# Patient Record
Sex: Female | Born: 1980 | Race: Black or African American | Hispanic: No | Marital: Single | Smoking: Never smoker
Health system: Southern US, Community
[De-identification: ages and names within clinical notes are randomized; demographics above are authoritative.]

## PROBLEM LIST (undated history)

## (undated) DIAGNOSIS — K219 Gastro-esophageal reflux disease without esophagitis: Secondary | ICD-10-CM

## (undated) DIAGNOSIS — IMO0001 Reserved for inherently not codable concepts without codable children: Secondary | ICD-10-CM

## (undated) DIAGNOSIS — D649 Anemia, unspecified: Secondary | ICD-10-CM

## (undated) DIAGNOSIS — E559 Vitamin D deficiency, unspecified: Secondary | ICD-10-CM

## (undated) DIAGNOSIS — D509 Iron deficiency anemia, unspecified: Secondary | ICD-10-CM

## (undated) DIAGNOSIS — I10 Essential (primary) hypertension: Secondary | ICD-10-CM

## (undated) DIAGNOSIS — O24419 Gestational diabetes mellitus in pregnancy, unspecified control: Secondary | ICD-10-CM

## (undated) DIAGNOSIS — G4733 Obstructive sleep apnea (adult) (pediatric): Secondary | ICD-10-CM

## (undated) HISTORY — DX: Obstructive sleep apnea (adult) (pediatric): G47.33

## (undated) HISTORY — PX: LAPAROSCOPIC GASTRIC SLEEVE RESECTION: SHX5895

## (undated) HISTORY — DX: Essential (primary) hypertension: I10

## (undated) HISTORY — DX: Gestational diabetes mellitus in pregnancy, unspecified control: O24.419

## (undated) HISTORY — DX: Gastro-esophageal reflux disease without esophagitis: K21.9

## (undated) HISTORY — DX: Reserved for inherently not codable concepts without codable children: IMO0001

---
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
## 2007-11-11 ENCOUNTER — Ambulatory Visit: Payer: Self-pay | Admitting: Internal Medicine

## 2009-08-24 ENCOUNTER — Observation Stay: Payer: Self-pay

## 2009-08-26 ENCOUNTER — Inpatient Hospital Stay: Payer: Self-pay

## 2013-07-08 HISTORY — PX: LAPAROSCOPIC GASTRIC SLEEVE RESECTION: SHX5895

## 2013-08-10 ENCOUNTER — Ambulatory Visit: Payer: Self-pay | Admitting: Specialist

## 2013-08-10 LAB — IRON AND TIBC
Iron Bind.Cap.(Total): 384 ug/dL (ref 250–450)
Iron Saturation: 16 %
Iron: 62 ug/dL (ref 50–170)
Unbound Iron-Bind.Cap.: 322 ug/dL

## 2013-08-10 LAB — CBC WITH DIFFERENTIAL/PLATELET
Basophil #: 0.1 10*3/uL (ref 0.0–0.1)
Basophil %: 1 %
Eosinophil #: 0.4 10*3/uL (ref 0.0–0.7)
Eosinophil %: 5.4 %
HCT: 34.1 % — ABNORMAL LOW (ref 35.0–47.0)
HGB: 11.4 g/dL — ABNORMAL LOW (ref 12.0–16.0)
Lymphocyte #: 2.1 10*3/uL (ref 1.0–3.6)
Lymphocyte %: 26.4 %
MCH: 29.5 pg (ref 26.0–34.0)
MCHC: 33.3 g/dL (ref 32.0–36.0)
MCV: 89 fL (ref 80–100)
Monocyte #: 0.4 x10 3/mm (ref 0.2–0.9)
Monocyte %: 4.5 %
Neutrophil #: 5 10*3/uL (ref 1.4–6.5)
Neutrophil %: 62.7 %
Platelet: 347 10*3/uL (ref 150–440)
RBC: 3.84 10*6/uL (ref 3.80–5.20)
RDW: 13.1 % (ref 11.5–14.5)
WBC: 7.9 10*3/uL (ref 3.6–11.0)

## 2013-08-10 LAB — COMPREHENSIVE METABOLIC PANEL
Albumin: 3.5 g/dL (ref 3.4–5.0)
Alkaline Phosphatase: 51 U/L
Anion Gap: 4 — ABNORMAL LOW (ref 7–16)
BUN: 11 mg/dL (ref 7–18)
Bilirubin,Total: 0.2 mg/dL (ref 0.2–1.0)
Calcium, Total: 8.7 mg/dL (ref 8.5–10.1)
Chloride: 103 mmol/L (ref 98–107)
Co2: 29 mmol/L (ref 21–32)
Creatinine: 0.77 mg/dL (ref 0.60–1.30)
EGFR (African American): >60
EGFR (Non-African Amer.): >60
Glucose: 81 mg/dL (ref 65–99)
Osmolality: 270 (ref 275–301)
Potassium: 3.7 mmol/L (ref 3.5–5.1)
SGOT(AST): 22 U/L (ref 15–37)
SGPT (ALT): 11 U/L — ABNORMAL LOW (ref 12–78)
Sodium: 136 mmol/L (ref 136–145)
Total Protein: 7.7 g/dL (ref 6.4–8.2)

## 2013-08-10 LAB — FOLATE: Folic Acid: 14.3 ng/mL (ref 3.1–100.0)

## 2013-08-10 LAB — LIPASE, BLOOD: Lipase: 122 U/L (ref 73–393)

## 2013-08-10 LAB — MAGNESIUM: Magnesium: 1.8 mg/dL

## 2013-08-10 LAB — APTT: Activated PTT: 31.2 secs (ref 23.6–35.9)

## 2013-08-10 LAB — FERRITIN: Ferritin (ARMC): 30 ng/mL (ref 8–388)

## 2013-08-10 LAB — PHOSPHORUS: Phosphorus: 3 mg/dL (ref 2.5–4.9)

## 2013-08-10 LAB — AMYLASE: Amylase: 61 U/L (ref 25–115)

## 2013-08-10 LAB — PROTIME-INR
INR: 1
Prothrombin Time: 13.1 secs (ref 11.5–14.7)

## 2013-08-10 LAB — HEMOGLOBIN A1C: Hemoglobin A1C: 5.8 % (ref 4.2–6.3)

## 2013-08-10 LAB — BILIRUBIN, DIRECT: Bilirubin, Direct: <0.1 mg/dL (ref 0.00–0.20)

## 2013-08-10 LAB — TSH: Thyroid Stimulating Horm: 1.97 u[IU]/mL

## 2013-08-19 ENCOUNTER — Ambulatory Visit: Payer: Self-pay | Admitting: Specialist

## 2013-08-23 ENCOUNTER — Ambulatory Visit: Payer: Self-pay | Admitting: Specialist

## 2013-09-05 ENCOUNTER — Ambulatory Visit: Payer: Self-pay | Admitting: Specialist

## 2013-09-14 ENCOUNTER — Ambulatory Visit: Payer: Self-pay | Admitting: Gastroenterology

## 2013-09-15 ENCOUNTER — Ambulatory Visit: Payer: Self-pay | Admitting: Specialist

## 2013-09-17 LAB — PATHOLOGY REPORT

## 2013-11-15 ENCOUNTER — Ambulatory Visit: Payer: Self-pay | Admitting: Specialist

## 2013-11-15 LAB — BASIC METABOLIC PANEL
Anion Gap: 6 — ABNORMAL LOW (ref 7–16)
BUN: 17 mg/dL (ref 7–18)
Calcium, Total: 8.7 mg/dL (ref 8.5–10.1)
Chloride: 107 mmol/L (ref 98–107)
Co2: 25 mmol/L (ref 21–32)
Creatinine: 0.83 mg/dL (ref 0.60–1.30)
EGFR (African American): >60
EGFR (Non-African Amer.): >60
Glucose: 95 mg/dL (ref 65–99)
Osmolality: 277 (ref 275–301)
Potassium: 4 mmol/L (ref 3.5–5.1)
Sodium: 138 mmol/L (ref 136–145)

## 2013-11-23 ENCOUNTER — Inpatient Hospital Stay: Payer: Self-pay | Admitting: Bariatrics

## 2013-11-24 LAB — BASIC METABOLIC PANEL
Anion Gap: 7 (ref 7–16)
BUN: 6 mg/dL — ABNORMAL LOW (ref 7–18)
Calcium, Total: 8.7 mg/dL (ref 8.5–10.1)
Chloride: 104 mmol/L (ref 98–107)
Co2: 22 mmol/L (ref 21–32)
Creatinine: 0.69 mg/dL (ref 0.60–1.30)
EGFR (African American): >60
EGFR (Non-African Amer.): >60
Glucose: 98 mg/dL (ref 65–99)
Osmolality: 264 (ref 275–301)
Potassium: 3.8 mmol/L (ref 3.5–5.1)
Sodium: 133 mmol/L — ABNORMAL LOW (ref 136–145)

## 2013-11-24 LAB — CBC WITH DIFFERENTIAL/PLATELET
Basophil #: 0 10*3/uL (ref 0.0–0.1)
Basophil %: 0.3 %
Eosinophil #: 0 10*3/uL (ref 0.0–0.7)
Eosinophil %: 0 %
HCT: 33.6 % — ABNORMAL LOW (ref 35.0–47.0)
HGB: 11 g/dL — ABNORMAL LOW (ref 12.0–16.0)
Lymphocyte #: 0.7 10*3/uL — ABNORMAL LOW (ref 1.0–3.6)
Lymphocyte %: 5.1 %
MCH: 28.7 pg (ref 26.0–34.0)
MCHC: 32.7 g/dL (ref 32.0–36.0)
MCV: 88 fL (ref 80–100)
Monocyte #: 0.4 x10 3/mm (ref 0.2–0.9)
Monocyte %: 2.8 %
Neutrophil #: 12.1 10*3/uL — ABNORMAL HIGH (ref 1.4–6.5)
Neutrophil %: 91.8 %
Platelet: 353 10*3/uL (ref 150–440)
RBC: 3.84 10*6/uL (ref 3.80–5.20)
RDW: 13.3 % (ref 11.5–14.5)
WBC: 13.2 10*3/uL — ABNORMAL HIGH (ref 3.6–11.0)

## 2013-11-24 LAB — MAGNESIUM: Magnesium: 1.8 mg/dL

## 2013-11-24 LAB — ALBUMIN: Albumin: 3.5 g/dL (ref 3.4–5.0)

## 2013-11-24 LAB — PHOSPHORUS: Phosphorus: 2.9 mg/dL (ref 2.5–4.9)

## 2013-11-25 LAB — PATHOLOGY REPORT

## 2013-12-14 ENCOUNTER — Ambulatory Visit: Payer: Self-pay | Admitting: Bariatrics

## 2014-01-05 ENCOUNTER — Ambulatory Visit: Payer: Self-pay | Admitting: Bariatrics

## 2014-10-29 NOTE — Op Note (Signed)
PATIENT NAME:  Angie Lamb, Angie Lamb MR#:  [id number] DATE OF BIRTH:  Dec 22, 1980  DATE OF PROCEDURE:  11/23/2013  PREOPERATIVE DIAGNOSIS: Morbid obesity.   POSTOPERATIVE DIAGNOSIS: Morbid obesity.   PROCEDURE: Laparoscopic sleeve gastrectomy.   SURGEON: Kreg Shropshire, M.D.   ASSISTANT: Liliane Bade, PA from (Dictation Anomaly)[employer].   ANESTHESIA: General endotracheal.   CLINICAL HISTORY: See H and P.   DETAILS OF PROCEDURE: The patient was taken to the operating room and placed on the operating room table, in the supine position, with appropriate monitors and supplemental oxygen being delivered.  Broad spectrum IV antibiotics were administered. The patient was placed under general anesthesia without incident.  The abdomen was prepped and draped in the usual sterile fashion.  Access was obtained using 5 mm Optical trocar. Pneumoperitoneum was established without difficulty. Multiple other ports were placed in preparation for sleeve gastrectomy. A liver retractor was placed without incident. The entire stomach was mobilized from 5 cm from the pylorus all the way up to the fundus, and the fundus was mobilized off the left crura as well completely freeing up the posterior portion of the stomach. Posterior attachments were taken down so the crura could be visualized from both sides.  At that point, everything was removed from the stomach and a 12 [place] Bougie was placed down into the antrum. An Echelon green load stapler was used to bisect the antrum on first fire starting approximately 5 to 6 cm from the pylorus. I then continued up along the Bougie using a blue load stapler with excellent affect with care not to get too close to the Bougie itself, with minimal traction. This continued all the way up to the left crura. The excess stomach was placed on the side and the Bougie was removed and endoscopy showed no evidence of obstruction at that time.  The excess stomach was removed through the abdominal  cavity, and the wounds were closed using 4-0 Vicryl and Dermabond.   ESTIMATED BLOOD LOSS: None.   SPECIMENS: Portion of stomach.  COMPLICATIONS: None.  BOUGIE SIZE: 36-French.   ____________________________ Kreg Shropshire, MD jb:aw D: 11/23/2013 11:33:09 ET T: 11/23/2013 11:39:46 ET JOB#: [id number]  cc: Kreg Shropshire, MD, <Dictator> Wille Glaser Langley Adie MD ELECTRONICALLY SIGNED 11/30/2013 9:25

## 2015-12-28 ENCOUNTER — Encounter: Payer: Self-pay | Admitting: Hematology and Oncology

## 2015-12-28 ENCOUNTER — Other Ambulatory Visit: Payer: Self-pay | Admitting: Hematology and Oncology

## 2015-12-28 ENCOUNTER — Inpatient Hospital Stay: Payer: BLUE CROSS/BLUE SHIELD | Attending: Hematology and Oncology | Admitting: Hematology and Oncology

## 2015-12-28 ENCOUNTER — Inpatient Hospital Stay: Payer: BLUE CROSS/BLUE SHIELD

## 2015-12-28 VITALS — BP 102/70 | HR 83 | Temp 97.9°F | Resp 16 | Ht 60.0 in | Wt 153.6 lb

## 2015-12-28 DIAGNOSIS — K5900 Constipation, unspecified: Secondary | ICD-10-CM | POA: Diagnosis not present

## 2015-12-28 DIAGNOSIS — D649 Anemia, unspecified: Secondary | ICD-10-CM

## 2015-12-28 DIAGNOSIS — Z9884 Bariatric surgery status: Secondary | ICD-10-CM

## 2015-12-28 DIAGNOSIS — G4730 Sleep apnea, unspecified: Secondary | ICD-10-CM

## 2015-12-28 DIAGNOSIS — D509 Iron deficiency anemia, unspecified: Secondary | ICD-10-CM | POA: Insufficient documentation

## 2015-12-28 LAB — IRON AND TIBC
Iron: 69 ug/dL (ref 28–170)
Saturation Ratios: 17 % (ref 10.4–31.8)
TIBC: 396 ug/dL (ref 250–450)
UIBC: 328 ug/dL

## 2015-12-28 LAB — COMPREHENSIVE METABOLIC PANEL
ALT: 8 U/L — ABNORMAL LOW (ref 14–54)
AST: 16 U/L (ref 15–41)
Albumin: 4.1 g/dL (ref 3.5–5.0)
Alkaline Phosphatase: 34 U/L — ABNORMAL LOW (ref 38–126)
Anion gap: 4 — ABNORMAL LOW (ref 5–15)
BUN: 17 mg/dL (ref 6–20)
CO2: 28 mmol/L (ref 22–32)
Calcium: 8.8 mg/dL — ABNORMAL LOW (ref 8.9–10.3)
Chloride: 102 mmol/L (ref 101–111)
Creatinine, Ser: 0.71 mg/dL (ref 0.44–1.00)
GFR calc Af Amer: >60 mL/min (ref >60)
GFR calc non Af Amer: >60 mL/min (ref >60)
Glucose, Bld: 83 mg/dL (ref 65–99)
Potassium: 4 mmol/L (ref 3.5–5.1)
Sodium: 134 mmol/L — ABNORMAL LOW (ref 135–145)
Total Bilirubin: 0.4 mg/dL (ref 0.3–1.2)
Total Protein: 7.4 g/dL (ref 6.5–8.1)

## 2015-12-28 LAB — CBC WITH DIFFERENTIAL/PLATELET
Basophils Absolute: 0.1 10*3/uL (ref 0–0.1)
Basophils Relative: 1 %
Eosinophils Absolute: 0.2 10*3/uL (ref 0–0.7)
Eosinophils Relative: 3 %
HCT: 33.7 % — ABNORMAL LOW (ref 35.0–47.0)
Hemoglobin: 11.3 g/dL — ABNORMAL LOW (ref 12.0–16.0)
Lymphocytes Relative: 26 %
Lymphs Abs: 1.7 10*3/uL (ref 1.0–3.6)
MCH: 30.7 pg (ref 26.0–34.0)
MCHC: 33.5 g/dL (ref 32.0–36.0)
MCV: 91.7 fL (ref 80.0–100.0)
Monocytes Absolute: 0.2 10*3/uL (ref 0.2–0.9)
Monocytes Relative: 4 %
Neutro Abs: 4.2 10*3/uL (ref 1.4–6.5)
Neutrophils Relative %: 66 %
Platelets: 296 10*3/uL (ref 150–440)
RBC: 3.67 MIL/uL — ABNORMAL LOW (ref 3.80–5.20)
RDW: 12.4 % (ref 11.5–14.5)
WBC: 6.4 10*3/uL (ref 3.6–11.0)

## 2015-12-28 LAB — FOLATE: Folate: 21.1 ng/mL (ref >5.9)

## 2015-12-28 LAB — VITAMIN B12: Vitamin B-12: 772 pg/mL (ref 180–914)

## 2015-12-28 LAB — FERRITIN: Ferritin: 23 ng/mL (ref 11–307)

## 2015-12-28 NOTE — Patient Instructions (Signed)
Iron Sucrose injection  What is this medicine?  IRON SUCROSE (AHY ern SOO krohs) is an iron complex. Iron is used to make healthy red blood cells, which carry oxygen and nutrients throughout the body. This medicine is used to treat iron deficiency anemia in people with chronic kidney disease.  This medicine may be used for other purposes; ask your health care provider or pharmacist if you have questions.  What should I tell my health care provider before I take this medicine?  They need to know if you have any of these conditions:  -anemia not caused by low iron levels  -heart disease  -high levels of iron in the blood  -kidney disease  -liver disease  -an unusual or allergic reaction to iron, other medicines, foods, dyes, or preservatives  -pregnant or trying to get pregnant  -breast-feeding  How should I use this medicine?  This medicine is for infusion into a vein. It is given by a health care professional in a hospital or clinic setting.  Talk to your pediatrician regarding the use of this medicine in children. While this drug may be prescribed for children as young as 2 years for selected conditions, precautions do apply.  Overdosage: If you think you have taken too much of this medicine contact a poison control center or emergency room at once.  NOTE: This medicine is only for you. Do not share this medicine with others.  What if I miss a dose?  It is important not to miss your dose. Call your doctor or health care professional if you are unable to keep an appointment.  What may interact with this medicine?  Do not take this medicine with any of the following medications:  -deferoxamine  -dimercaprol  -other iron products  This medicine may also interact with the following medications:  -chloramphenicol  -deferasirox  This list may not describe all possible interactions. Give your health care provider a list of all the medicines, herbs, non-prescription drugs, or dietary supplements you use. Also tell them if  you smoke, drink alcohol, or use illegal drugs. Some items may interact with your medicine.  What should I watch for while using this medicine?  Visit your doctor or healthcare professional regularly. Tell your doctor or healthcare professional if your symptoms do not start to get better or if they get worse. You may need blood work done while you are taking this medicine.  You may need to follow a special diet. Talk to your doctor. Foods that contain iron include: whole grains/cereals, dried fruits, beans, or peas, leafy green vegetables, and organ meats (liver, kidney).  What side effects may I notice from receiving this medicine?  Side effects that you should report to your doctor or health care professional as soon as possible:  -allergic reactions like skin rash, itching or hives, swelling of the face, lips, or tongue  -breathing problems  -changes in blood pressure  -cough  -fast, irregular heartbeat  -feeling faint or lightheaded, falls  -fever or chills  -flushing, sweating, or hot feelings  -joint or muscle aches/pains  -seizures  -swelling of the ankles or feet  -unusually weak or tired  Side effects that usually do not require medical attention (report to your doctor or health care professional if they continue or are bothersome):  -diarrhea  -feeling achy  -headache  -irritation at site where injected  -nausea, vomiting  -stomach upset  -tiredness  This list may not describe all possible side effects. Call your doctor   for medical advice about side effects. You may report side effects to FDA at 1-800-FDA-1088.  Where should I keep my medicine?  This drug is given in a hospital or clinic and will not be stored at home.  NOTE: This sheet is a summary. It may not cover all possible information. If you have questions about this medicine, talk to your doctor, pharmacist, or health care provider.      2016, Elsevier/Gold Standard. (2011-04-04 17:14:35)

## 2015-12-28 NOTE — Progress Notes (Signed)
[facility] [facility] day:  12/28/2015  Chief Complaint: Angie Lamb is a 35 y.o. female s/p gastric sleeve with anemia who is referred in consultation by Dr. Kreg Shropshire for assessment and management.  HPI: The patient notes a history of anemia requiring oral iron during pregnancy.  She underwent gastric sleeve surgery in 11/2013.  She has lost 87 pounds since her surgery.  She has met her goal weight.  Weight has been stable for 1 year.  She stats that she was put on oral iron before and after her surgery.  She takes 1 iron pill a day without OJ or vitamin C.  She has never had a transfusion.  Regarding her diet, she "eats a little of everything".  She has a protein shake for breakfast.  She eats meat 2 times a day.  She eats small portions.  She eats green leafy vegetables once a day.  She denies any pica.  She denies any melena or hematochezia.  Menses is normal, but slightly longer (4 days). She denies any hematuria.    She is on no new medications or herbal products.  She denies any family history of anemia.  Labs on 09/27/2014 revealed a hematocrit of 35.7, hemoglobin 11.6, MCV 91.3, platelets 384,000, WBC 7100 with an ANC of 4900.  Creatinine was 0.63.  Liver function tests were normal.  B12 was 1344.  Folate was > 20.  Ferritin was 46.  Symptomatically, she has been "tired forever".  She describes sleep apnea before her surgery.  Past Medical History  Diagnosis Date  . Sleep apnea, obstructive   . Reflux     Past Surgical History  Procedure Laterality Date  . Laparoscopic gastric sleeve resection      History reviewed. No pertinent family history.  Social History:  reports that she has never smoked. She has never used smokeless tobacco. She reports that she drinks alcohol. She reports that she does not use illicit drugs.  She has 3 children (age 39, 47, 60).  The patient is alone today.  Allergies:  Allergies  Allergen Reactions  .  Nsaids Other (See Comments)    Current Medications: Current Outpatient Prescriptions  Medication Sig Dispense Refill  . b complex vitamins capsule Take by mouth.    . calcium-vitamin D (SM CALCIUM 500/VITAMIN D3) 500-400 MG-UNIT tablet Take by mouth.    . Ferrous Sulfate (IRON) 325 (65 Fe) MG TABS Take by mouth.    Marland Kitchen FINACEA 15 % FOAM Reported on 12/28/2015  0  . Magnesium Ascorbate POWD     . Multiple Vitamin (MULTI-VITAMINS) TABS Take by mouth.    . fluticasone (FLONASE) 50 MCG/ACT nasal spray Place into the nose. Reported on 12/28/2015     No current facility-administered medications for this visit.    Review of Systems:  GENERAL:  Fatigued.  No energy.  No fevers or sweats.  No weight loss in past year (87 pounds lost since surgery in 2015). PERFORMANCE STATUS (ECOG):  1 HEENT:  No visual changes, runny nose, sore throat, mouth sores or tenderness. Lungs: No shortness of breath or cough.  No hemoptysis. Cardiac:  No chest pain, palpitations, orthopnea, or PND. GI:  No nausea, vomiting, diarrhea, constipation, melena or hematochezia. GU:  No urgency, frequency, dysuria, or hematuria. Musculoskeletal:  No back pain.  No joint pain.  No muscle tenderness. Extremities:  No pain or swelling. Skin:  No rashes or skin changes. Neuro:  No headache, numbness  or weakness, balance or coordination issues. Endocrine:  No diabetes, thyroid issues, hot flashes or night sweats. Psych:  No mood changes, depression or anxiety. Pain:  No focal pain. Review of systems:  All other systems reviewed and found to be negative.  Physical Exam: Blood pressure 102/70, pulse 83, temperature 97.9 F (36.6 C), resp. rate 16, height 5' (1.524 m), weight 153 lb 8.8 oz (69.65 kg). GENERAL:  Well developed, well nourished, sitting comfortably in the exam room in no acute distress. MENTAL STATUS:  Alert and oriented to person, place and time. HEAD:  Shoulder length brown hair.  Normocephalic, atraumatic, face  symmetric, no Cushingoid features. EYES:  Glasses.  Brown eyes.  Pupils equal round and reactive to light and accomodation.  No conjunctivitis or scleral icterus. ENT:  Oropharynx clear without lesion.  Tongue normal. Mucous membranes moist.  RESPIRATORY:  Clear to auscultation without rales, wheezes or rhonchi. CARDIOVASCULAR:  Regular rate and rhythm without murmur, rub or gallop. ABDOMEN:  Soft, non-tender, with active bowel sounds, and no hepatosplenomegaly.  No masses. SKIN:  No rashes, ulcers or lesions. EXTREMITIES: No edema, no skin discoloration or tenderness.  No palpable cords. LYMPH NODES: No palpable cervical, supraclavicular, axillary or inguinal adenopathy  NEUROLOGICAL: Unremarkable. PSYCH:  Appropriate.  Appointment on 12/28/2015  Component Date Value Ref Range Status  . WBC 12/28/2015 6.4  3.6 - 11.0 K/uL Final  . RBC 12/28/2015 3.67* 3.80 - 5.20 MIL/uL Final  . Hemoglobin 12/28/2015 11.3* 12.0 - 16.0 g/dL Final  . HCT 12/28/2015 33.7* 35.0 - 47.0 % Final  . MCV 12/28/2015 91.7  80.0 - 100.0 fL Final  . MCH 12/28/2015 30.7  26.0 - 34.0 pg Final  . MCHC 12/28/2015 33.5  32.0 - 36.0 g/dL Final  . RDW 12/28/2015 12.4  11.5 - 14.5 % Final  . Platelets 12/28/2015 296  150 - 440 K/uL Final  . Neutrophils Relative % 12/28/2015 66   Final  . Neutro Abs 12/28/2015 4.2  1.4 - 6.5 K/uL Final  . Lymphocytes Relative 12/28/2015 26   Final  . Lymphs Abs 12/28/2015 1.7  1.0 - 3.6 K/uL Final  . Monocytes Relative 12/28/2015 4   Final  . Monocytes Absolute 12/28/2015 0.2  0.2 - 0.9 K/uL Final  . Eosinophils Relative 12/28/2015 3   Final  . Eosinophils Absolute 12/28/2015 0.2  0 - 0.7 K/uL Final  . Basophils Relative 12/28/2015 1   Final  . Basophils Absolute 12/28/2015 0.1  0 - 0.1 K/uL Final  . Sodium 12/28/2015 134* 135 - 145 mmol/L Final  . Potassium 12/28/2015 4.0  3.5 - 5.1 mmol/L Final  . Chloride 12/28/2015 102  101 - 111 mmol/L Final  . CO2 12/28/2015 28  22 - 32 mmol/L  Final  . Glucose, Bld 12/28/2015 83  65 - 99 mg/dL Final  . BUN 12/28/2015 17  6 - 20 mg/dL Final  . Creatinine, Ser 12/28/2015 0.71  0.44 - 1.00 mg/dL Final  . Calcium 12/28/2015 8.8* 8.9 - 10.3 mg/dL Final  . Total Protein 12/28/2015 7.4  6.5 - 8.1 g/dL Final  . Albumin 12/28/2015 4.1  3.5 - 5.0 g/dL Final  . AST 12/28/2015 16  15 - 41 U/L Final  . ALT 12/28/2015 8* 14 - 54 U/L Final  . Alkaline Phosphatase 12/28/2015 34* 38 - 126 U/L Final  . Total Bilirubin 12/28/2015 0.4  0.3 - 1.2 mg/dL Final  . GFR calc non Af Amer 12/28/2015 >60  >60 mL/min Final  .  GFR calc Af Amer 12/28/2015 >60  >60 mL/min Final   Comment: (NOTE) The eGFR has been calculated using the CKD EPI equation. This calculation has not been validated in all clinical situations. eGFR's persistently <60 mL/min signify possible Chronic Kidney Disease.   . Anion gap 12/28/2015 4* 5 - 15 Final  . Ferritin 12/28/2015 23  11 - 307 ng/mL Final  . Iron 12/28/2015 69  28 - 170 ug/dL Final  . TIBC 12/28/2015 396  250 - 450 ug/dL Final  . Saturation Ratios 12/28/2015 17  10.4 - 31.8 % Final  . UIBC 12/28/2015 328   Final  . Vitamin B-12 12/28/2015 772  180 - 914 pg/mL Final   Comment: (NOTE) This assay is not validated for testing neonatal or myeloproliferative syndrome specimens for Vitamin B12 levels. Performed at [facility]   . Folate 12/28/2015 21.1  >5.9 ng/mL Final    Assessment:  Angie Lamb is a 36 y.o. female s/p gastric sleeve surgery in 11/2013.  She has a normocytic anemia.  She has been on oral iron since before her surgery.  She takes B complex vitamins.  She denies any melena or hematochezia.  Menses are normal.  Diet is good.  She denies any pica.  Symptomatically, she notes chronic fatigue.  She has a history of sleep apnea prior to her weight loss.  Exam is unremarkable.  Plan: 1.  Review diagnosis of anemia.  Discuss possible iron or B12 deficiency despite oral supplementation in patients  with gastric surgery.  Discuss taking oral iron with vitamin C or OJ.  Discuss baseline labs.  Discuss possible IV iron.  Side effects reviewed. 2.  Labs today:  CBC with diff, CMP, ferritin, iron studies, B12, folate. 3.  Preauth Venofer 4.  RTC on 01/05/2016 for review of work-up and +/- Venofer.   Lequita Asal, MD  12/28/2015, 3:48 PM

## 2015-12-28 NOTE — Progress Notes (Signed)
Pt reports she has moderate fatigue.  She goes to work and other activities but she would just like to be home resting.  Takes OTC iron daily.

## 2016-01-05 ENCOUNTER — Encounter: Payer: Self-pay | Admitting: Hematology and Oncology

## 2016-01-05 ENCOUNTER — Other Ambulatory Visit: Payer: Self-pay | Admitting: Hematology and Oncology

## 2016-01-05 ENCOUNTER — Inpatient Hospital Stay (HOSPITAL_BASED_OUTPATIENT_CLINIC_OR_DEPARTMENT_OTHER): Payer: BLUE CROSS/BLUE SHIELD | Admitting: Hematology and Oncology

## 2016-01-05 ENCOUNTER — Inpatient Hospital Stay: Payer: BLUE CROSS/BLUE SHIELD

## 2016-01-05 VITALS — BP 116/74 | HR 82 | Temp 98.1°F | Wt 156.3 lb

## 2016-01-05 DIAGNOSIS — G4730 Sleep apnea, unspecified: Secondary | ICD-10-CM | POA: Diagnosis not present

## 2016-01-05 DIAGNOSIS — K5900 Constipation, unspecified: Secondary | ICD-10-CM | POA: Diagnosis not present

## 2016-01-05 DIAGNOSIS — D509 Iron deficiency anemia, unspecified: Secondary | ICD-10-CM

## 2016-01-05 DIAGNOSIS — D649 Anemia, unspecified: Secondary | ICD-10-CM | POA: Diagnosis not present

## 2016-01-05 DIAGNOSIS — Z9884 Bariatric surgery status: Secondary | ICD-10-CM

## 2016-01-05 DIAGNOSIS — Z79899 Other long term (current) drug therapy: Secondary | ICD-10-CM

## 2016-01-05 NOTE — Progress Notes (Addendum)
[place] [place] [facility] day:  01/05/2016  Chief Complaint: MAKEDA PEEKS is a 35 y.o. female s/p gastric sleeve with anemia who is seen for review of work-up and discussion regarding direction of therapy.  HPI: The patient was last seen in the hematology clinic on 12/28/2015.  At that time, she was seen for initial consultation regarding anemia.  She had a mild normocytic anemia.  She had been on oral iron since before her surgery in 11/2013.  She was taking B complex vitamins.  She denied any bleeding.  Work-up revealed a hematocrit 33.7, hemoglobin 11.3 and MCV 91.7.  Comprehensive metabolic panel was normal.  Ferritin was 23 (low).  Iron studies included a saturation of 17% and a TIBC of 396.  Vitamin B12 was 772 and folate 21.1.  During the interim, she notes no concerns.  She has constipation for which she is using probiotics.   Past Medical History  Diagnosis Date  . Sleep apnea, obstructive   . Reflux     Past Surgical History  Procedure Laterality Date  . Laparoscopic gastric sleeve resection      History reviewed. No pertinent family history.  Social History:  reports that she has never smoked. She has never used smokeless tobacco. She reports that she drinks alcohol. She reports that she does not use illicit drugs.  She has 3 children (age 35, 19, 35).  The patient is alone today.  Allergies:  Allergies  Allergen Reactions  . Nsaids Other (See Comments)    Current Medications: Current Outpatient Prescriptions  Medication Sig Dispense Refill  . calcium-vitamin D (SM CALCIUM 500/VITAMIN D3) 500-400 MG-UNIT tablet Take by mouth.    . Ferrous Sulfate (IRON) 325 (65 Fe) MG TABS Take by mouth.    Marland Kitchen FINACEA 15 % FOAM Reported on 12/28/2015  0  . Magnesium Ascorbate POWD     . Multiple Vitamin (MULTI-VITAMINS) TABS Take by mouth.    Marland Kitchen b complex vitamins capsule Take by mouth.     No current facility-administered medications for this visit.     Review of Systems:  GENERAL:  Fatigued.  No energy.  No fevers or sweats.  No weight loss in past year (87 pounds lost since surgery in 2015). PERFORMANCE STATUS (ECOG):  1 HEENT:  No visual changes, runny nose, sore throat, mouth sores or tenderness. Lungs: No shortness of breath or cough.  No hemoptysis. Cardiac:  No chest pain, palpitations, orthopnea, or PND. GI:  Constipation.  No nausea, vomiting, diarrhea,  melena or hematochezia. GU:  No urgency, frequency, dysuria, or hematuria. Musculoskeletal:  No back pain.  No joint pain.  No muscle tenderness. Extremities:  No pain or swelling. Skin:  No rashes or skin changes. Neuro:  No headache, numbness or weakness, balance or coordination issues. Endocrine:  No diabetes, thyroid issues, hot flashes or night sweats. Psych:  No mood changes, depression or anxiety. Pain:  No focal pain. Review of systems:  All other systems reviewed and found to be negative.  Physical Exam: Blood pressure 116/74, pulse 82, temperature 98.1 F (36.7 C), temperature source Tympanic, weight 156 lb 4.9 oz (70.9 kg). GENERAL:  Well developed, well nourished, sitting comfortably in the exam room in no acute distress. MENTAL STATUS:  Alert and oriented to person, place and time. HEAD:  Shoulder length brown hair.  Normocephalic, atraumatic, face symmetric, no Cushingoid features. EYES:  Glasses.  Brown eyes.  No conjunctivitis or scleral icterus. NEUROLOGICAL: Unremarkable. PSYCH:  Appropriate.  No visits with results within 3 Day(s) from this visit. Latest known visit with results is:  Appointment on 12/28/2015  Component Date Value Ref Range Status  . WBC 12/28/2015 6.4  3.6 - 11.0 K/uL Final  . RBC 12/28/2015 3.67* 3.80 - 5.20 MIL/uL Final  . Hemoglobin 12/28/2015 11.3* 12.0 - 16.0 g/dL Final  . HCT 12/28/2015 33.7* 35.0 - 47.0 % Final  . MCV 12/28/2015 91.7  80.0 - 100.0 fL Final  . MCH 12/28/2015 30.7  26.0 - 34.0 pg Final  . MCHC 12/28/2015  33.5  32.0 - 36.0 g/dL Final  . RDW 12/28/2015 12.4  11.5 - 14.5 % Final  . Platelets 12/28/2015 296  150 - 440 K/uL Final  . Neutrophils Relative % 12/28/2015 66   Final  . Neutro Abs 12/28/2015 4.2  1.4 - 6.5 K/uL Final  . Lymphocytes Relative 12/28/2015 26   Final  . Lymphs Abs 12/28/2015 1.7  1.0 - 3.6 K/uL Final  . Monocytes Relative 12/28/2015 4   Final  . Monocytes Absolute 12/28/2015 0.2  0.2 - 0.9 K/uL Final  . Eosinophils Relative 12/28/2015 3   Final  . Eosinophils Absolute 12/28/2015 0.2  0 - 0.7 K/uL Final  . Basophils Relative 12/28/2015 1   Final  . Basophils Absolute 12/28/2015 0.1  0 - 0.1 K/uL Final  . Sodium 12/28/2015 134* 135 - 145 mmol/L Final  . Potassium 12/28/2015 4.0  3.5 - 5.1 mmol/L Final  . Chloride 12/28/2015 102  101 - 111 mmol/L Final  . CO2 12/28/2015 28  22 - 32 mmol/L Final  . Glucose, Bld 12/28/2015 83  65 - 99 mg/dL Final  . BUN 12/28/2015 17  6 - 20 mg/dL Final  . Creatinine, Ser 12/28/2015 0.71  0.44 - 1.00 mg/dL Final  . Calcium 12/28/2015 8.8* 8.9 - 10.3 mg/dL Final  . Total Protein 12/28/2015 7.4  6.5 - 8.1 g/dL Final  . Albumin 12/28/2015 4.1  3.5 - 5.0 g/dL Final  . AST 12/28/2015 16  15 - 41 U/L Final  . ALT 12/28/2015 8* 14 - 54 U/L Final  . Alkaline Phosphatase 12/28/2015 34* 38 - 126 U/L Final  . Total Bilirubin 12/28/2015 0.4  0.3 - 1.2 mg/dL Final  . GFR calc non Af Amer 12/28/2015 >60  >60 mL/min Final  . GFR calc Af Amer 12/28/2015 >60  >60 mL/min Final   Comment: (NOTE) The eGFR has been calculated using the CKD EPI equation. This calculation has not been validated in all clinical situations. eGFR's persistently <60 mL/min signify possible Chronic Kidney Disease.   . Anion gap 12/28/2015 4* 5 - 15 Final  . Ferritin 12/28/2015 23  11 - 307 ng/mL Final  . Iron 12/28/2015 69  28 - 170 ug/dL Final  . TIBC 12/28/2015 396  250 - 450 ug/dL Final  . Saturation Ratios 12/28/2015 17  10.4 - 31.8 % Final  . UIBC 12/28/2015 328   Final   . Vitamin B-12 12/28/2015 772  180 - 914 pg/mL Final   Comment: (NOTE) This assay is not validated for testing neonatal or myeloproliferative syndrome specimens for Vitamin B12 levels. Performed at [facility]   . Folate 12/28/2015 21.1  >5.9 ng/mL Final    Assessment:  MARGARIE MCGUIRT is a 35 y.o. female s/p gastric sleeve surgery in 11/2013.  She has a normocytic anemia.  She has been on oral iron since before her surgery.  She takes B complex vitamins.  She has mild iron deficiency anemia.  Work-up on 12/28/2015 revealed a low ferritin (23).  B12 and folate were normal.  She denies any melena or hematochezia.  Menses are normal.  Diet is good.  She denies any pica.  Symptomatically, she notes chronic fatigue.  She has a history of sleep apnea prior to her weight loss.  Exam is unremarkable.  Plan: 1.  Review work-up.  Discuss taking oral iron with vitamin C or OJ.  Discuss increasing iron as tolerated to 1 BID. Discuss management of constipation.  Discuss possible IV iron if intolerant of oral iron. 2.  Preauth Venofer 3.  RTC in 1 month for labs (CBC with diff, ferritin, retic). 4.  RTC in 3 months for MD assess and labs (CBC with diff, ferritin).   Lequita Asal, MD  01/05/2016, 1:59 PM

## 2016-01-06 ENCOUNTER — Encounter: Payer: Self-pay | Admitting: Hematology and Oncology

## 2016-02-02 ENCOUNTER — Inpatient Hospital Stay: Payer: BLUE CROSS/BLUE SHIELD | Attending: Hematology and Oncology | Admitting: *Deleted

## 2016-02-02 DIAGNOSIS — D509 Iron deficiency anemia, unspecified: Secondary | ICD-10-CM | POA: Insufficient documentation

## 2016-02-02 LAB — RETICULOCYTES
RBC.: 3.57 MIL/uL — ABNORMAL LOW (ref 3.80–5.20)
Retic Count, Absolute: 28.6 10*3/uL (ref 19.0–183.0)
Retic Ct Pct: 0.8 % (ref 0.4–3.1)

## 2016-02-02 LAB — CBC WITH DIFFERENTIAL/PLATELET
Basophils Absolute: 0 10*3/uL (ref 0–0.1)
Basophils Relative: 1 %
Eosinophils Absolute: 0.2 10*3/uL (ref 0–0.7)
Eosinophils Relative: 3 %
HCT: 32.6 % — ABNORMAL LOW (ref 35.0–47.0)
Hemoglobin: 11.1 g/dL — ABNORMAL LOW (ref 12.0–16.0)
Lymphocytes Relative: 29 %
Lymphs Abs: 2 10*3/uL (ref 1.0–3.6)
MCH: 31 pg (ref 26.0–34.0)
MCHC: 34 g/dL (ref 32.0–36.0)
MCV: 91.2 fL (ref 80.0–100.0)
Monocytes Absolute: 0.3 10*3/uL (ref 0.2–0.9)
Monocytes Relative: 5 %
Neutro Abs: 4.2 10*3/uL (ref 1.4–6.5)
Neutrophils Relative %: 62 %
Platelets: 268 10*3/uL (ref 150–440)
RBC: 3.57 MIL/uL — ABNORMAL LOW (ref 3.80–5.20)
RDW: 12.8 % (ref 11.5–14.5)
WBC: 6.9 10*3/uL (ref 3.6–11.0)

## 2016-02-02 LAB — FERRITIN: Ferritin: 21 ng/mL (ref 11–307)

## 2016-04-05 ENCOUNTER — Other Ambulatory Visit: Payer: BLUE CROSS/BLUE SHIELD

## 2016-04-05 ENCOUNTER — Ambulatory Visit: Payer: BLUE CROSS/BLUE SHIELD | Admitting: Hematology and Oncology

## 2017-07-25 ENCOUNTER — Other Ambulatory Visit: Payer: Self-pay | Admitting: Nurse Practitioner

## 2017-07-25 DIAGNOSIS — E669 Obesity, unspecified: Secondary | ICD-10-CM

## 2017-07-25 MED ORDER — PHENTERMINE HCL 37.5 MG PO TABS: 37.5000 mg | ORAL_TABLET | Freq: Every day | ORAL | 0 refills | Status: DC

## 2017-07-25 NOTE — Progress Notes (Signed)
Sent single, 30 day prescription for phentermine daily. Take once. Will need to be seen for further refills.

## 2017-09-16 ENCOUNTER — Other Ambulatory Visit: Payer: Self-pay

## 2017-09-16 MED ORDER — OSELTAMIVIR PHOSPHATE 75 MG PO CAPS: 75.0000 mg | ORAL_CAPSULE | Freq: Two times a day (BID) | ORAL | 0 refills | Status: DC

## 2017-10-06 ENCOUNTER — Encounter: Payer: Self-pay | Admitting: Nurse Practitioner

## 2017-10-06 ENCOUNTER — Ambulatory Visit: Payer: BLUE CROSS/BLUE SHIELD | Admitting: Nurse Practitioner

## 2017-10-06 VITALS — BP 122/72 | HR 78 | Resp 16 | Ht 61.0 in | Wt 168.8 lb

## 2017-10-06 DIAGNOSIS — R5383 Other fatigue: Secondary | ICD-10-CM | POA: Diagnosis not present

## 2017-10-06 DIAGNOSIS — D509 Iron deficiency anemia, unspecified: Secondary | ICD-10-CM | POA: Diagnosis not present

## 2017-10-06 DIAGNOSIS — E669 Obesity, unspecified: Secondary | ICD-10-CM | POA: Insufficient documentation

## 2017-10-06 MED ORDER — PHENTERMINE HCL 37.5 MG PO TABS: 37.5000 mg | ORAL_TABLET | Freq: Every day | ORAL | 1 refills | Status: DC

## 2017-10-06 NOTE — Progress Notes (Signed)
[facility] [place], [place] [address]  Internal MEDICINE  Office Visit Note  Patient Name: Angie Lamb  [id number]  [id number]  Date of Service: 10/06/2017  Chief Complaint  Patient presents with  . Fatigue    The patient is here for routine follow up. Biggest concern is fatigue. She does work full time and is rasing three children. Feels like weather and work stress does play a part in the fatigue she is feeling. She states that she had stopped taking her iron supplements and other vitamins over past few months. She recently started taking them again. She did restart phentermine daily. This does help with increased energy while at work, but still feels wiped out at the end of the day.    Pt is here for routine follow up.    Current Medication: Outpatient Encounter Medications as of 10/06/2017  Medication Sig Note  . b complex vitamins capsule Take by mouth. 12/28/2015: Received from: [place]  . calcium-vitamin D (SM CALCIUM 500/VITAMIN D3) 500-400 MG-UNIT tablet Take by mouth. 12/28/2015: Received from: [place]  . Ferrous Sulfate (IRON) 325 (65 Fe) MG TABS Take by mouth.   . Magnesium Ascorbate POWD  12/28/2015: Received from: [facility]  . Multiple Vitamin (MULTI-VITAMINS) TABS Take by mouth. 12/28/2015: Received from: [place]  . phentermine (ADIPEX-P) 37.5 MG tablet Take 1 tablet (37.5 mg total) by mouth daily before breakfast.   . [DISCONTINUED] phentermine (ADIPEX-P) 37.5 MG tablet Take 1 tablet (37.5 mg total) by mouth daily before breakfast.   . [DISCONTINUED] FINACEA 15 % FOAM Reported on 12/28/2015 12/28/2015: Received from: External Pharmacy  . [DISCONTINUED] oseltamivir (TAMIFLU) 75 MG capsule Take 1 capsule (75 mg total) by mouth 2 (two) times daily. For 5 days (Patient not taking: Reported on 10/06/2017)    No facility-administered encounter medications on file as of  10/06/2017.     Surgical History: Past Surgical History:  Procedure Laterality Date  . LAPAROSCOPIC GASTRIC SLEEVE RESECTION      Medical History: Past Medical History:  Diagnosis Date  . Reflux   . Sleep apnea, obstructive     Family History: History reviewed. No pertinent family history.  Social History   Socioeconomic History  . Marital status: Single    Spouse name: Not on file  . Number of children: Not on file  . Years of education: Not on file  . Highest education level: Not on file  Occupational History  . Not on file  Social Needs  . Financial resource strain: Not on file  . Food insecurity:    Worry: Not on file    Inability: Not on file  . Transportation needs:    Medical: Not on file    Non-medical: Not on file  Tobacco Use  . Smoking status: Never Smoker  . Smokeless tobacco: Never Used  Substance and Sexual Activity  . Alcohol use: Yes    Comment: Occasionally every few months   . Drug use: No  . Sexual activity: Not on file  Lifestyle  . Physical activity:    Days per week: Not on file    Minutes per session: Not on file  . Stress: Not on file  Relationships  . Social connections:    Talks on phone: Not on file    Gets together: Not on file    Attends religious service: Not on file    Active member of club or  organization: Not on file    Attends meetings of clubs or organizations: Not on file    Relationship status: Not on file  . Intimate partner violence:    Fear of current or ex partner: Not on file    Emotionally abused: Not on file    Physically abused: Not on file    Forced sexual activity: Not on file  Other Topics Concern  . Not on file  Social History Narrative  . Not on file      Review of Systems  Constitutional: Positive for fatigue. Negative for chills and unexpected weight change.  HENT: Negative for congestion, postnasal drip, rhinorrhea, sneezing and sore throat.   Eyes: Negative.  Negative for redness.   Respiratory: Negative for cough, chest tightness, shortness of breath and wheezing.   Cardiovascular: Negative for chest pain and palpitations.  Gastrointestinal: Negative for abdominal pain, constipation, diarrhea, nausea and vomiting.  Endocrine: Negative for cold intolerance, heat intolerance, polydipsia, polyphagia and polyuria.  Genitourinary: Negative.  Negative for dysuria and frequency.  Musculoskeletal: Negative for arthralgias, back pain, joint swelling, myalgias and neck pain.  Skin: Negative for rash.  Allergic/Immunologic: Negative for environmental allergies.  Neurological: Negative for tremors, numbness and headaches.  Hematological: Negative for adenopathy. Does not bruise/bleed easily.  Psychiatric/Behavioral: Negative for behavioral problems (Depression), sleep disturbance and suicidal ideas. The patient is not nervous/anxious.     Vital Signs: BP 122/72   Pulse 78   Resp 16   Ht 5\' 1"  (1.549 m)   Wt 168 lb 12.8 oz (76.6 kg)   SpO2 99%   BMI 31.89 kg/m    Physical Exam  Constitutional: She is oriented to person, place, and time. She appears well-developed and well-nourished. No distress.  HENT:  Head: Normocephalic and atraumatic.  Mouth/Throat: Oropharynx is clear and moist. No oropharyngeal exudate.  Eyes: Pupils are equal, round, and reactive to light. EOM are normal.  Neck: Normal range of motion. Neck supple. No JVD present. No tracheal deviation present. No thyromegaly present.  Cardiovascular: Normal rate, regular rhythm and normal heart sounds. Exam reveals no gallop and no friction rub.  No murmur heard. Pulmonary/Chest: Effort normal. No respiratory distress. She has no wheezes. She has no rales. She exhibits no tenderness.  Abdominal: Soft. Bowel sounds are normal.  Musculoskeletal: Normal range of motion.  Lymphadenopathy:    She has no cervical adenopathy.  Neurological: She is alert and oriented to person, place, and time. No cranial nerve  deficit.  Skin: Skin is warm and dry. She is not diaphoretic.  Psychiatric: She has a normal mood and affect. Her behavior is normal. Judgment and thought content normal.  Nursing note and vitals reviewed.   Assessment/Plan:  1. Mild obesity - phentermine (ADIPEX-P) 37.5 MG tablet; Take 1 tablet (37.5 mg total) by mouth daily before breakfast.  Dispense: 30 tablet; Refill: 1  2. Other fatigue Likely related to busy lifestyle and demanding job. Recommend she continue with dietary supplements every day. Encouraged her to participate in physical activity 20-30 minutes, three to four times per week. Increase this gradually.   3. Iron deficiency anemia, unspecified iron deficiency anemia type Check labs after her next visit.   General Counseling: Cherylene verbalizes understanding of the findings of todays visit and agrees with plan of treatment. I have discussed any further diagnostic evaluation that may be needed or ordered today. We also reviewed her medications today. she has been encouraged to call the office with any questions or concerns that  should arise related to todays visit.    There is a liability release in patients' chart. There has been a 10 minute discussion about the side effects including but not limited to elevated blood pressure, anxiety, lack of sleep and dry mouth. Pt understands and will like to start/continue on appetite suppressant at this time. There will be one month RX given at the time of visit with proper follow up. [place] diet plan with restricted calories is given to the pt. Pt understands and agrees with  plan of treatment  This patient was seen by Leretha Pol, FNP- C in Collaboration with Dr Lavera Guise as a part of collaborative care agreement     Meds ordered this encounter  Medications  . phentermine (ADIPEX-P) 37.5 MG tablet    Sig: Take 1 tablet (37.5 mg total) by mouth daily before breakfast.    Dispense:  30 tablet    Refill:  1    Order Specific  Question:   Supervising Provider    Answer:   Lavera Guise [[id number]]    Time spent: 51 Minutes     Dr Lavera Guise Internal medicine

## 2017-11-17 ENCOUNTER — Ambulatory Visit: Payer: Self-pay | Admitting: Nurse Practitioner

## 2017-12-16 ENCOUNTER — Encounter: Payer: Self-pay | Admitting: Nurse Practitioner

## 2017-12-16 ENCOUNTER — Ambulatory Visit: Payer: BLUE CROSS/BLUE SHIELD | Admitting: Nurse Practitioner

## 2017-12-16 VITALS — BP 118/80 | HR 73 | Temp 98.0°F | Resp 16 | Ht 60.0 in | Wt 173.2 lb

## 2017-12-16 DIAGNOSIS — N3001 Acute cystitis with hematuria: Secondary | ICD-10-CM | POA: Diagnosis not present

## 2017-12-16 DIAGNOSIS — R300 Dysuria: Secondary | ICD-10-CM

## 2017-12-16 DIAGNOSIS — B373 Candidiasis of vulva and vagina: Secondary | ICD-10-CM | POA: Diagnosis not present

## 2017-12-16 DIAGNOSIS — B3731 Acute candidiasis of vulva and vagina: Secondary | ICD-10-CM

## 2017-12-16 LAB — POCT URINALYSIS DIPSTICK
Bilirubin, UA: NEGATIVE
Glucose, UA: NEGATIVE
Ketones, UA: NEGATIVE
Leukocytes, UA: NEGATIVE
Nitrite, UA: NEGATIVE
Protein, UA: NEGATIVE
Spec Grav, UA: 1.015 (ref 1.010–1.025)
Urobilinogen, UA: 0.2 E.U./dL
pH, UA: 6 (ref 5.0–8.0)

## 2017-12-16 MED ORDER — AMOXICILLIN-POT CLAVULANATE 875-125 MG PO TABS: 1.0000 | ORAL_TABLET | Freq: Two times a day (BID) | ORAL | 0 refills | Status: DC

## 2017-12-16 MED ORDER — NYSTATIN 100000 UNIT/GM EX POWD: Freq: Four times a day (QID) | CUTANEOUS | 1 refills | Status: DC

## 2017-12-16 MED ORDER — FLUCONAZOLE 150 MG PO TABS: ORAL_TABLET | ORAL | 1 refills | Status: DC

## 2017-12-16 NOTE — Progress Notes (Signed)
[facility] [place], [place] [address]  Internal MEDICINE  Office Visit Note  Patient Name: Angie Lamb  [id number]  [id number]  Date of Service: 01/04/2018   Pt is here for a sick visit.  Chief Complaint  Patient presents with  . Vaginitis     The patient is c/o vaginal pain with discharge for past few days. She has some burning with urination as well. She has no abdominal pain, nausea, or vomiting. She has no fever or chills.        Current Medication:  Outpatient Encounter Medications as of 12/16/2017  Medication Sig Note  . amoxicillin-clavulanate (AUGMENTIN) 875-125 MG tablet Take 1 tablet by mouth 2 (two) times daily.   Marland Kitchen b complex vitamins capsule Take by mouth. 12/28/2015: Received from: [place]  . calcium-vitamin D (SM CALCIUM 500/VITAMIN D3) 500-400 MG-UNIT tablet Take by mouth. 12/28/2015: Received from: [place]  . Ferrous Sulfate (IRON) 325 (65 Fe) MG TABS Take by mouth.   . fluconazole (DIFLUCAN) 150 MG tablet Take 1 tablet po once. May repeat dose in 3 days as needed for persistent symptoms.   . Magnesium Ascorbate POWD  12/28/2015: Received from: [facility] [place]  . Multiple Vitamin (MULTI-VITAMINS) TABS Take by mouth. 12/28/2015: Received from: [place]  . nystatin (MYCOSTATIN/NYSTOP) powder Apply topically 4 (four) times daily.   . phentermine (ADIPEX-P) 37.5 MG tablet Take 1 tablet (37.5 mg total) by mouth daily before breakfast. (Patient not taking: Reported on 12/16/2017)    No facility-administered encounter medications on file as of 12/16/2017.       Medical History: Past Medical History:  Diagnosis Date  . Reflux   . Sleep apnea, obstructive      Today's Vitals   12/16/17 1137  BP: 118/80  Pulse: 73  Resp: 16  Temp: 98 F (36.7 C)  SpO2: 96%  Weight: 173 lb 3.2 oz (78.6 kg)  Height: 5' (1.524 m)   Review of Systems  Constitutional:  Negative for activity change, chills, fatigue and unexpected weight change.  HENT: Negative for congestion, postnasal drip, rhinorrhea, sneezing, sore throat and voice change.   Eyes: Negative.  Negative for redness.  Respiratory: Negative for cough, chest tightness, shortness of breath and wheezing.   Cardiovascular: Negative for chest pain and palpitations.  Gastrointestinal: Negative for abdominal distention, abdominal pain, constipation, diarrhea, nausea and vomiting.  Endocrine: Negative for cold intolerance, heat intolerance, polydipsia, polyphagia and polyuria.  Genitourinary: Positive for dysuria and vaginal discharge. Negative for frequency.       Vaginal itching and irritation.   Musculoskeletal: Negative for arthralgias, back pain, joint swelling and neck pain.  Skin: Negative for rash.  Allergic/Immunologic: Negative for environmental allergies.  Neurological: Negative for dizziness, tremors, weakness, numbness and headaches.  Hematological: Negative for adenopathy. Does not bruise/bleed easily.  Psychiatric/Behavioral: Negative for behavioral problems (Depression), sleep disturbance and suicidal ideas. The patient is not nervous/anxious.     Physical Exam  Constitutional: She is oriented to person, place, and time. She appears well-developed and well-nourished. No distress.  HENT:  Head: Normocephalic and atraumatic.  Nose: Nose normal.  Mouth/Throat: Oropharynx is clear and moist. No oropharyngeal exudate.  Eyes: Pupils are equal, round, and reactive to light. Conjunctivae and EOM are normal.  Neck: Normal range of motion. Neck supple. No JVD present. No tracheal deviation present. No thyromegaly present.  Cardiovascular: Normal rate, regular rhythm and normal heart sounds. Exam reveals no  gallop and no friction rub.  No murmur heard. Pulmonary/Chest: Effort normal and breath sounds normal. No respiratory distress. She has no wheezes. She has no rales. She exhibits no  tenderness.  Abdominal: Soft. Bowel sounds are normal. There is no tenderness.  Genitourinary:  Genitourinary Comments: Urine sample positive for small blood.   Musculoskeletal: Normal range of motion.  Lymphadenopathy:    She has no cervical adenopathy.  Neurological: She is alert and oriented to person, place, and time. No cranial nerve deficit.  Skin: Skin is warm and dry. Capillary refill takes less than 2 seconds. She is not diaphoretic.  Psychiatric: She has a normal mood and affect. Her behavior is normal. Judgment and thought content normal.  Nursing note and vitals reviewed.  Assessment/Plan: 1. Acute cystitis with hematuria Start augmentin 875mg  bid for 10 days. Send urine for culture and sensitivity and adjust antibiotics as indicated.  - CULTURE, URINE COMPREHENSIVE - amoxicillin-clavulanate (AUGMENTIN) 875-125 MG tablet; Take 1 tablet by mouth 2 (two) times daily.  Dispense: 10 tablet; Refill: 0  2. Vaginal candidiasis Add diflucan 150mg  once. May repeat in 3 days for persistent symptoms. May also use nystatin powder up to four times daily to help reduce symptoms.  - fluconazole (DIFLUCAN) 150 MG tablet; Take 1 tablet po once. May repeat dose in 3 days as needed for persistent symptoms.  Dispense: 5 tablet; Refill: 1 - nystatin (MYCOSTATIN/NYSTOP) powder; Apply topically 4 (four) times daily.  Dispense: 30 g; Refill: 1  3. Dysuria - POCT Urinalysis Dipstick  General Counseling: [name] verbalizes understanding of the findings of todays visit and agrees with plan of treatment. I have discussed any further diagnostic evaluation that may be needed or ordered today. We also reviewed her medications today. she has been encouraged to call the office with any questions or concerns that should arise related to todays visit.    Counseling:  This patient was seen by Leretha Pol, FNP- C in Collaboration with Dr Lavera Guise as a part of collaborative care agreement  Orders  Placed This Encounter  Procedures  . CULTURE, URINE COMPREHENSIVE  . POCT Urinalysis Dipstick    Meds ordered this encounter  Medications  . amoxicillin-clavulanate (AUGMENTIN) 875-125 MG tablet    Sig: Take 1 tablet by mouth 2 (two) times daily.    Dispense:  10 tablet    Refill:  0    Order Specific Question:   Supervising Provider    Answer:   Lavera Guise [[id number]]  . fluconazole (DIFLUCAN) 150 MG tablet    Sig: Take 1 tablet po once. May repeat dose in 3 days as needed for persistent symptoms.    Dispense:  5 tablet    Refill:  1    Order Specific Question:   Supervising Provider    Answer:   Lavera Guise [[id number]]  . nystatin (MYCOSTATIN/NYSTOP) powder    Sig: Apply topically 4 (four) times daily.    Dispense:  30 g    Refill:  1    Order Specific Question:   Supervising Provider    Answer:   Lavera Guise [[id number]]    Time spent: 15 Minutes

## 2017-12-20 LAB — CULTURE, URINE COMPREHENSIVE

## 2018-01-04 ENCOUNTER — Encounter: Payer: Self-pay | Admitting: Nurse Practitioner

## 2018-01-04 DIAGNOSIS — B3731 Acute candidiasis of vulva and vagina: Secondary | ICD-10-CM | POA: Insufficient documentation

## 2018-01-04 DIAGNOSIS — N3001 Acute cystitis with hematuria: Secondary | ICD-10-CM | POA: Insufficient documentation

## 2018-01-04 DIAGNOSIS — B373 Candidiasis of vulva and vagina: Secondary | ICD-10-CM | POA: Insufficient documentation

## 2018-01-04 DIAGNOSIS — R300 Dysuria: Secondary | ICD-10-CM | POA: Insufficient documentation

## 2018-01-22 ENCOUNTER — Ambulatory Visit: Payer: BLUE CROSS/BLUE SHIELD | Admitting: Nurse Practitioner

## 2018-01-22 ENCOUNTER — Encounter: Payer: Self-pay | Admitting: Nurse Practitioner

## 2018-01-22 VITALS — BP 111/71 | HR 93 | Resp 16 | Ht 61.0 in | Wt 172.0 lb

## 2018-01-22 DIAGNOSIS — D509 Iron deficiency anemia, unspecified: Secondary | ICD-10-CM | POA: Diagnosis not present

## 2018-01-22 DIAGNOSIS — E669 Obesity, unspecified: Secondary | ICD-10-CM

## 2018-01-22 MED ORDER — PHENTERMINE HCL 37.5 MG PO TABS: 37.5000 mg | ORAL_TABLET | Freq: Every day | ORAL | 2 refills | Status: DC

## 2018-01-22 NOTE — Progress Notes (Signed)
[facility] [place], [place] [address]  Internal MEDICINE  Office Visit Note  Patient Name: Angie Lamb  [id number]  [id number]  Date of Service: 02/14/2018  Chief Complaint  Patient presents with  . Obesity    6wk weight check  . other    recheck labs    The patient is here for management of weight loss. She is currently taking phentermine to help curb appetite. She has lost 1 pound since her last visit. She reports no negative side effects associated with taking this medication.       Current Medication: Outpatient Encounter Medications as of 01/22/2018  Medication Sig Note  . amoxicillin-clavulanate (AUGMENTIN) 875-125 MG tablet Take 1 tablet by mouth 2 (two) times daily.   Marland Kitchen b complex vitamins capsule Take by mouth. 12/28/2015: Received from: [place]  . calcium-vitamin D (SM CALCIUM 500/VITAMIN D3) 500-400 MG-UNIT tablet Take by mouth. 12/28/2015: Received from: [place]  . Ferrous Sulfate (IRON) 325 (65 Fe) MG TABS Take by mouth.   . fluconazole (DIFLUCAN) 150 MG tablet Take 1 tablet po once. May repeat dose in 3 days as needed for persistent symptoms.   . Magnesium Ascorbate POWD  12/28/2015: Received from: [facility]  . Multiple Vitamin (MULTI-VITAMINS) TABS Take by mouth. 12/28/2015: Received from: [place]  . nystatin (MYCOSTATIN/NYSTOP) powder Apply topically 4 (four) times daily.   . phentermine (ADIPEX-P) 37.5 MG tablet Take 1 tablet (37.5 mg total) by mouth daily before breakfast.   . [DISCONTINUED] phentermine (ADIPEX-P) 37.5 MG tablet Take 1 tablet (37.5 mg total) by mouth daily before breakfast. (Patient not taking: Reported on 12/16/2017) 01/22/2018: Pt would like to use [facility] for this prescription   No facility-administered encounter medications on file as of 01/22/2018.     Surgical History: Past Surgical History:  Procedure Laterality Date  .  LAPAROSCOPIC GASTRIC SLEEVE RESECTION      Medical History: Past Medical History:  Diagnosis Date  . Reflux   . Sleep apnea, obstructive     Family History: Family History  Problem Relation Age of Onset  . Diabetes Paternal Grandmother     Social History   Socioeconomic History  . Marital status: Single    Spouse name: Not on file  . Number of children: Not on file  . Years of education: Not on file  . Highest education level: Not on file  Occupational History  . Not on file  Social Needs  . Financial resource strain: Not on file  . Food insecurity:    Worry: Not on file    Inability: Not on file  . Transportation needs:    Medical: Not on file    Non-medical: Not on file  Tobacco Use  . Smoking status: Never Smoker  . Smokeless tobacco: Never Used  Substance and Sexual Activity  . Alcohol use: Yes    Comment: Occasionally every few months   . Drug use: No  . Sexual activity: Not on file  Lifestyle  . Physical activity:    Days per week: Not on file    Minutes per session: Not on file  . Stress: Not on file  Relationships  . Social connections:    Talks on phone: Not on file    Gets together: Not on file    Attends religious service: Not on file    Active member of club or organization: Not on file  Attends meetings of clubs or organizations: Not on file    Relationship status: Not on file  . Intimate partner violence:    Fear of current or ex partner: Not on file    Emotionally abused: Not on file    Physically abused: Not on file    Forced sexual activity: Not on file  Other Topics Concern  . Not on file  Social History Narrative  . Not on file      Review of Systems  Constitutional: Negative for activity change, chills, fatigue and unexpected weight change.       One pound weight loss since most recent visit.   HENT: Negative for congestion, postnasal drip, rhinorrhea, sneezing, sore throat and voice change.   Eyes: Negative.  Negative for  redness.  Respiratory: Negative for cough, chest tightness, shortness of breath and wheezing.   Cardiovascular: Negative for chest pain and palpitations.  Gastrointestinal: Negative for abdominal distention, abdominal pain, constipation, diarrhea, nausea and vomiting.  Endocrine: Negative for cold intolerance, heat intolerance, polydipsia, polyphagia and polyuria.  Genitourinary: Positive for vaginal discharge. Negative for dysuria, flank pain and frequency.       Vaginal itching and irritation.   Musculoskeletal: Negative for arthralgias, back pain, joint swelling and neck pain.  Skin: Negative for rash.  Allergic/Immunologic: Negative for environmental allergies.  Neurological: Negative for dizziness, tremors, weakness, numbness and headaches.  Hematological: Negative for adenopathy. Does not bruise/bleed easily.  Psychiatric/Behavioral: Negative for behavioral problems (Depression), sleep disturbance and suicidal ideas. The patient is not nervous/anxious.     Today's Vitals   01/22/18 1554  BP: 111/71  Pulse: 93  Resp: 16  SpO2: 99%  Weight: 172 lb (78 kg)  Height: 5\' 1"  (1.549 m)    Physical Exam  Constitutional: She is oriented to person, place, and time. She appears well-developed and well-nourished. No distress.  HENT:  Head: Normocephalic and atraumatic.  Nose: Nose normal.  Mouth/Throat: Oropharynx is clear and moist. No oropharyngeal exudate.  Eyes: Pupils are equal, round, and reactive to light. Conjunctivae and EOM are normal.  Neck: Normal range of motion. Neck supple. No JVD present. No tracheal deviation present. No thyromegaly present.  Cardiovascular: Normal rate, regular rhythm and normal heart sounds. Exam reveals no gallop and no friction rub.  No murmur heard. Pulmonary/Chest: Effort normal and breath sounds normal. No respiratory distress. She has no wheezes. She has no rales. She exhibits no tenderness.  Abdominal: Soft. Bowel sounds are normal. There is no  tenderness.  Musculoskeletal: Normal range of motion.  Lymphadenopathy:    She has no cervical adenopathy.  Neurological: She is alert and oriented to person, place, and time. No cranial nerve deficit.  Skin: Skin is warm and dry. Capillary refill takes less than 2 seconds. She is not diaphoretic.  Psychiatric: She has a normal mood and affect. Her behavior is normal. Judgment and thought content normal.  Nursing note and vitals reviewed.  Assessment/Plan: 1. Iron deficiency anemia, unspecified iron deficiency anemia type Labs stable. Continue oral iron supplement as prescribed.   2. Mild obesity Continue phentermine 37.5mg  tablets. Recommend 1200 calorie diet.  Participate in routine physical activity.  - phentermine (ADIPEX-P) 37.5 MG tablet; Take 1 tablet (37.5 mg total) by mouth daily before breakfast.  Dispense: 30 tablet; Refill: 2  General Counseling: Nickola verbalizes understanding of the findings of todays visit and agrees with plan of treatment. I have discussed any further diagnostic evaluation that may be needed or ordered today. We also  reviewed her medications today. she has been encouraged to call the office with any questions or concerns that should arise related to todays visit.  This patient was seen by [place] with Dr Lavera Guise as a part of collaborative care agreement    Meds ordered this encounter  Medications  . phentermine (ADIPEX-P) 37.5 MG tablet    Sig: Take 1 tablet (37.5 mg total) by mouth daily before breakfast.    Dispense:  30 tablet    Refill:  2    Written rx provided today    Order Specific Question:   Supervising Provider    Answer:   Lavera Guise [[id number]]    Time spent: 21 Minutes      Dr Lavera Guise Internal medicine

## 2018-03-02 ENCOUNTER — Encounter: Payer: Self-pay | Admitting: Adult Health

## 2018-03-02 ENCOUNTER — Ambulatory Visit: Payer: BLUE CROSS/BLUE SHIELD | Admitting: Adult Health

## 2018-03-02 VITALS — BP 119/84 | HR 97 | Temp 98.9°F | Resp 16 | Ht 60.0 in | Wt 166.0 lb

## 2018-03-02 DIAGNOSIS — B3731 Acute candidiasis of vulva and vagina: Secondary | ICD-10-CM

## 2018-03-02 DIAGNOSIS — B373 Candidiasis of vulva and vagina: Secondary | ICD-10-CM

## 2018-03-02 DIAGNOSIS — L309 Dermatitis, unspecified: Secondary | ICD-10-CM

## 2018-03-02 MED ORDER — TRIAMCINOLONE ACETONIDE 0.1 % EX CREA: 1.0000 "application " | TOPICAL_CREAM | Freq: Two times a day (BID) | CUTANEOUS | 0 refills | Status: DC

## 2018-03-02 MED ORDER — DOXYCYCLINE HYCLATE 100 MG PO TABS: 100.0000 mg | ORAL_TABLET | Freq: Two times a day (BID) | ORAL | 0 refills | Status: DC

## 2018-03-02 MED ORDER — FLUCONAZOLE 150 MG PO TABS: ORAL_TABLET | ORAL | 1 refills | Status: DC

## 2018-03-02 NOTE — Progress Notes (Signed)
[facility] [place], [place] [address]  Internal MEDICINE  Office Visit Note  Patient Name: Angie Lamb  [id number]  [id number]  Date of Service: 03/15/2018  Chief Complaint  Patient presents with  . Rash    all on the chest and back     HPI Pt is here for a sick visit. Pt reports diffuse non-puritic rash across breasts and back for greater than 2 weeks.  She has tried OTC hydrocortisone cream with no success. The skin over the spots is becoming dry and peeling with steroid use, however redness remains.    Current Medication:  Outpatient Encounter Medications as of 03/02/2018  Medication Sig Note  . amoxicillin-clavulanate (AUGMENTIN) 875-125 MG tablet Take 1 tablet by mouth 2 (two) times daily.   Marland Kitchen b complex vitamins capsule Take by mouth. 12/28/2015: Received from: [place]  . calcium-vitamin D (SM CALCIUM 500/VITAMIN D3) 500-400 MG-UNIT tablet Take by mouth. 12/28/2015: Received from: [place]  . Ferrous Sulfate (IRON) 325 (65 Fe) MG TABS Take by mouth.   . fluconazole (DIFLUCAN) 150 MG tablet Take 1 tablet po once. May repeat dose in 3 days as needed for persistent symptoms.   . Magnesium Ascorbate POWD  12/28/2015: Received from: [facility]  . Multiple Vitamin (MULTI-VITAMINS) TABS Take by mouth. 12/28/2015: Received from: [place]  . nystatin (MYCOSTATIN/NYSTOP) powder Apply topically 4 (four) times daily.   . phentermine (ADIPEX-P) 37.5 MG tablet Take 1 tablet (37.5 mg total) by mouth daily before breakfast.   . [DISCONTINUED] fluconazole (DIFLUCAN) 150 MG tablet Take 1 tablet po once. May repeat dose in 3 days as needed for persistent symptoms.   Marland Kitchen doxycycline (VIBRA-TABS) 100 MG tablet Take 1 tablet (100 mg total) by mouth 2 (two) times daily.   Marland Kitchen triamcinolone cream (KENALOG) 0.1 % Apply 1 application topically 2 (two) times daily.    No facility-administered  encounter medications on file as of 03/02/2018.       Medical History: Past Medical History:  Diagnosis Date  . Reflux   . Sleep apnea, obstructive      Vital Signs: BP 119/84   Pulse 97   Temp 98.9 F (37.2 C)   Resp 16   Ht 5' (1.524 m)   Wt 166 lb (75.3 kg)   SpO2 100%   BMI 32.42 kg/m    Review of Systems  Constitutional: Negative for chills, fatigue and unexpected weight change.  HENT: Negative for congestion, rhinorrhea, sneezing and sore throat.   Eyes: Negative for photophobia, pain and redness.  Respiratory: Negative for cough, chest tightness and shortness of breath.   Cardiovascular: Negative for chest pain and palpitations.  Gastrointestinal: Negative for abdominal pain, constipation, diarrhea, nausea and vomiting.  Endocrine: Negative.   Genitourinary: Negative for dysuria and frequency.  Musculoskeletal: Negative for arthralgias, back pain, joint swelling and neck pain.  Skin: Negative for rash.  Allergic/Immunologic: Negative.   Neurological: Negative for tremors and numbness.  Hematological: Negative for adenopathy. Does not bruise/bleed easily.  Psychiatric/Behavioral: Negative for behavioral problems and sleep disturbance. The patient is not nervous/anxious.     Physical Exam  Constitutional: She is oriented to person, place, and time. She appears well-developed and well-nourished. No distress.  HENT:  Head: Normocephalic and atraumatic.  Mouth/Throat: Oropharynx is clear and moist. No oropharyngeal exudate.  Eyes: Pupils are equal, round, and reactive to light. EOM are normal.  Neck: Normal range of motion.  Neck supple. No JVD present. No tracheal deviation present. No thyromegaly present.  Cardiovascular: Normal rate, regular rhythm and normal heart sounds. Exam reveals no gallop and no friction rub.  No murmur heard. Pulmonary/Chest: Effort normal and breath sounds normal. No respiratory distress. She has no wheezes. She has no rales. She  exhibits no tenderness.  Abdominal: Soft. There is no tenderness. There is no guarding.  Musculoskeletal: Normal range of motion.  Lymphadenopathy:    She has no cervical adenopathy.  Neurological: She is alert and oriented to person, place, and time. No cranial nerve deficit.  Skin: Skin is warm and dry. She is not diaphoretic.  Psychiatric: She has a normal mood and affect. Her behavior is normal. Judgment and thought content normal.  Nursing note and vitals reviewed.  Assessment/Plan: 1. Dermatitis Red diffuse rash across breast and back.  Will try doxy and triamcinolone.  Will follow up in a few weeks to see if any better. If no resolution, will refer to dermatology.  - doxycycline (VIBRA-TABS) 100 MG tablet; Take 1 tablet (100 mg total) by mouth 2 (two) times daily.  Dispense: 20 tablet; Refill: 0 - triamcinolone cream (KENALOG) 0.1 %; Apply 1 application topically 2 (two) times daily.  Dispense: 30 g; Refill: 0  2. Vaginal candidiasis - fluconazole (DIFLUCAN) 150 MG tablet; Take 1 tablet po once. May repeat dose in 3 days as needed for persistent symptoms.  Dispense: 3 tablet; Refill: 1  General Counseling: Julena verbalizes understanding of the findings of todays visit and agrees with plan of treatment. I have discussed any further diagnostic evaluation that may be needed or ordered today. We also reviewed her medications today. she has been encouraged to call the office with any questions or concerns that should arise related to todays visit.   Meds ordered this encounter  Medications  . doxycycline (VIBRA-TABS) 100 MG tablet    Sig: Take 1 tablet (100 mg total) by mouth 2 (two) times daily.    Dispense:  20 tablet    Refill:  0  . fluconazole (DIFLUCAN) 150 MG tablet    Sig: Take 1 tablet po once. May repeat dose in 3 days as needed for persistent symptoms.    Dispense:  3 tablet    Refill:  1  . triamcinolone cream (KENALOG) 0.1 %    Sig: Apply 1 application topically 2  (two) times daily.    Dispense:  30 g    Refill:  0    Time spent: 25 Minutes  This patient was seen by Orson Gear AGNP-C in Collaboration with Dr Lavera Guise as a part of collaborative care agreement

## 2018-03-02 NOTE — Patient Instructions (Signed)

## 2018-03-26 ENCOUNTER — Encounter: Payer: Self-pay | Admitting: Nurse Practitioner

## 2018-03-26 ENCOUNTER — Ambulatory Visit (INDEPENDENT_AMBULATORY_CARE_PROVIDER_SITE_OTHER): Payer: BLUE CROSS/BLUE SHIELD | Admitting: Nurse Practitioner

## 2018-03-26 VITALS — BP 123/83 | HR 95 | Resp 16 | Ht 60.0 in | Wt 167.4 lb

## 2018-03-26 DIAGNOSIS — E559 Vitamin D deficiency, unspecified: Secondary | ICD-10-CM | POA: Diagnosis not present

## 2018-03-26 DIAGNOSIS — Z0001 Encounter for general adult medical examination with abnormal findings: Secondary | ICD-10-CM | POA: Diagnosis not present

## 2018-03-26 DIAGNOSIS — Z23 Encounter for immunization: Secondary | ICD-10-CM

## 2018-03-26 DIAGNOSIS — E538 Deficiency of other specified B group vitamins: Secondary | ICD-10-CM

## 2018-03-26 DIAGNOSIS — R5383 Other fatigue: Secondary | ICD-10-CM

## 2018-03-26 DIAGNOSIS — R300 Dysuria: Secondary | ICD-10-CM

## 2018-03-26 DIAGNOSIS — L309 Dermatitis, unspecified: Secondary | ICD-10-CM

## 2018-03-26 MED ORDER — TRIAMCINOLONE ACETONIDE 0.1 % EX CREA: 1.0000 "application " | TOPICAL_CREAM | Freq: Two times a day (BID) | CUTANEOUS | 2 refills | Status: DC

## 2018-03-26 NOTE — Progress Notes (Signed)
[facility] [place], [place] [address]  Internal MEDICINE  Office Visit Note  Patient Name: Angie Lamb  [id number]  [id number]  Date of Service: 04/05/2018   Pt is here for routine health maintenance examination   Chief Complaint  Patient presents with  . Annual Exam    pt stated that she has started trying the keto diet     The patient is concerned about difficulty with weight loss. She has had weight loss surgery and was very successful and then reached plateau. She has tried multiple medications for weight loss, as well as reducing her calorie intake as well as participating in routine exercise, without a lot of success. She also feels tired quite often. This has been intermittent issue since she had her weight loss surgery.    Current Medication: Outpatient Encounter Medications as of 03/26/2018  Medication Sig Note  . b complex vitamins capsule Take by mouth. 12/28/2015: Received from: [place]  . calcium-vitamin D (SM CALCIUM 500/VITAMIN D3) 500-400 MG-UNIT tablet Take by mouth. 12/28/2015: Received from: [place]  . doxycycline (VIBRA-TABS) 100 MG tablet Take 1 tablet (100 mg total) by mouth 2 (two) times daily.   . Ferrous Sulfate (IRON) 325 (65 Fe) MG TABS Take by mouth.   . fluconazole (DIFLUCAN) 150 MG tablet Take 1 tablet po once. May repeat dose in 3 days as needed for persistent symptoms.   . Magnesium Ascorbate POWD  12/28/2015: Received from: [facility]  . Multiple Vitamin (MULTI-VITAMINS) TABS Take by mouth. 12/28/2015: Received from: [place]  . phentermine (ADIPEX-P) 37.5 MG tablet Take 1 tablet (37.5 mg total) by mouth daily before breakfast.   . triamcinolone cream (KENALOG) 0.1 % Apply 1 application topically 2 (two) times daily.   . [DISCONTINUED] nystatin (MYCOSTATIN/NYSTOP) powder Apply topically 4 (four) times daily.   . [DISCONTINUED] triamcinolone  cream (KENALOG) 0.1 % Apply 1 application topically 2 (two) times daily.   . [DISCONTINUED] amoxicillin-clavulanate (AUGMENTIN) 875-125 MG tablet Take 1 tablet by mouth 2 (two) times daily. (Patient not taking: Reported on 03/26/2018)    No facility-administered encounter medications on file as of 03/26/2018.     Surgical History: Past Surgical History:  Procedure Laterality Date  . LAPAROSCOPIC GASTRIC SLEEVE RESECTION      Medical History: Past Medical History:  Diagnosis Date  . Reflux   . Sleep apnea, obstructive     Family History: Family History  Problem Relation Age of Onset  . Diabetes Paternal Grandmother       Review of Systems  Constitutional: Positive for fatigue. Negative for activity change, chills and unexpected weight change.       One pound weight gain since most recent visit.   HENT: Negative for congestion, postnasal drip, rhinorrhea, sneezing, sore throat and voice change.   Eyes: Negative.  Negative for redness.  Respiratory: Negative for cough, chest tightness, shortness of breath and wheezing.   Cardiovascular: Negative for chest pain and palpitations.  Gastrointestinal: Negative for abdominal distention, abdominal pain, constipation, diarrhea, nausea and vomiting.  Endocrine: Negative for cold intolerance, heat intolerance, polydipsia, polyphagia and polyuria.  Genitourinary: Negative for dysuria, flank pain, frequency and vaginal discharge.  Musculoskeletal: Negative for arthralgias, back pain, joint swelling and neck pain.  Skin: Negative for rash.  Allergic/Immunologic: Negative for environmental allergies.  Neurological: Negative for dizziness, tremors, weakness, numbness and headaches.  Hematological: Negative for adenopathy. Does not bruise/bleed easily.  Psychiatric/Behavioral:  Negative for behavioral problems (Depression), sleep disturbance and suicidal ideas. The patient is not nervous/anxious.      Today's Vitals   03/26/18 1625  BP:  123/83  Pulse: 95  Resp: 16  SpO2: 100%  Weight: 167 lb 6.4 oz (75.9 kg)  Height: 5' (1.524 m)    Physical Exam  Constitutional: She is oriented to person, place, and time. She appears well-developed and well-nourished. No distress.  HENT:  Head: Normocephalic and atraumatic.  Nose: Nose normal.  Mouth/Throat: Oropharynx is clear and moist. No oropharyngeal exudate.  Eyes: Pupils are equal, round, and reactive to light. Conjunctivae and EOM are normal.  Neck: Normal range of motion. Neck supple. No JVD present. No tracheal deviation present. No thyromegaly present.  Cardiovascular: Normal rate, regular rhythm, normal heart sounds and intact distal pulses. Exam reveals no gallop and no friction rub.  No murmur heard. Pulmonary/Chest: Effort normal and breath sounds normal. No respiratory distress. She has no wheezes. She has no rales. She exhibits no tenderness. Right breast exhibits no inverted nipple, no mass, no nipple discharge, no skin change and no tenderness. Left breast exhibits no inverted nipple, no mass, no nipple discharge, no skin change and no tenderness.  Abdominal: Soft. Bowel sounds are normal. There is no tenderness.  Musculoskeletal: Normal range of motion.  Lymphadenopathy:    She has no cervical adenopathy.  Neurological: She is alert and oriented to person, place, and time. No cranial nerve deficit.  Skin: Skin is warm and dry. Capillary refill takes less than 2 seconds. She is not diaphoretic.  Psychiatric: She has a normal mood and affect. Her behavior is normal. Judgment and thought content normal.  Nursing note and vitals reviewed.    LABS: Recent Results (from the past 2160 hour(s))  UA/M w/rflx Culture, Routine     Status: Abnormal   Collection Time: 03/26/18  4:23 PM  Result Value Ref Range   Specific Gravity, UA      >=1.030 (A) 1.005 - 1.030   pH, UA 5.0 5.0 - 7.5   Color, UA Yellow Yellow   Appearance Ur Clear Clear   Leukocytes, UA Negative  Negative   Protein, UA Negative Negative/Trace   Glucose, UA Negative Negative   Ketones, UA 3+ (A) Negative   RBC, UA Negative Negative   Bilirubin, UA Negative Negative   Urobilinogen, Ur 0.2 0.2 - 1.0 mg/dL   Nitrite, UA Negative Negative   Microscopic Examination Comment     Comment: Microscopic follows if indicated.   Microscopic Examination See below:     Comment: Microscopic was indicated and was performed.   Urinalysis Reflex Comment     Comment: This specimen will not reflex to a Urine Culture.  Microscopic Examination     Status: Abnormal   Collection Time: 03/26/18  4:23 PM  Result Value Ref Range   WBC, UA 0-5 0 - 5 /hpf   RBC, UA 0-2 0 - 2 /hpf   Epithelial Cells (non renal) 0-10 0 - 10 /hpf   Casts Present (A) None seen /lpf   Cast Type Hyaline casts N/A   Crystals Present (A) N/A   Crystal Type Calcium Oxalate N/A   Mucus, UA Present Not Estab.   Bacteria, UA Few None seen/Few   Assessment/Plan: 1. Encounter for general adult medical examination with abnormal findings Annual health maintenance exam today. - CBC with Differential/Platelet - Comprehensive metabolic panel - T4, free - TSH - Lipid panel  2. Other fatigue Check labs  including full thyroid and anemia panels.  - T4, free - TSH - Iron, TIBC and Ferritin Panel - Vitamin B12  3. Vitamin B12 deficiency Check b12 levels.  - Vitamin B12  4. Vitamin D deficiency - Vitamin D 1,25 dihydroxy  5. Dermatitis - triamcinolone cream (KENALOG) 0.1 %; Apply 1 application topically 2 (two) times daily.  Dispense: 60 g; Refill: 2  6. Needs flu shot - Flu Vaccine MDCK QUAD PF  7. Dysuria - UA/M w/rflx Culture, Routine  General Counseling: Nadyne verbalizes understanding of the findings of todays visit and agrees with plan of treatment. I have discussed any further diagnostic evaluation that may be needed or ordered today. We also reviewed her medications today. she has been encouraged to call the office  with any questions or concerns that should arise related to todays visit.    Counseling:  This patient was seen by Leretha Pol FNP Collaboration with Dr Lavera Guise as a part of collaborative care agreement  Orders Placed This Encounter  Procedures  . Microscopic Examination  . Flu Vaccine MDCK QUAD PF  . UA/M w/rflx Culture, Routine  . CBC with Differential/Platelet  . Comprehensive metabolic panel  . T4, free  . TSH  . Lipid panel  . Vitamin D 1,25 dihydroxy  . Iron, TIBC and Ferritin Panel  . Vitamin B12    Meds ordered this encounter  Medications  . triamcinolone cream (KENALOG) 0.1 %    Sig: Apply 1 application topically 2 (two) times daily.    Dispense:  60 g    Refill:  2    Order Specific Question:   Supervising Provider    Answer:   Lavera Guise [[id number]]    Time spent: [place], MD  Internal Medicine

## 2018-03-27 LAB — UA/M W/RFLX CULTURE, ROUTINE
Bilirubin, UA: NEGATIVE
Glucose, UA: NEGATIVE
Leukocytes, UA: NEGATIVE
Nitrite, UA: NEGATIVE
Protein, UA: NEGATIVE
RBC, UA: NEGATIVE
Specific Gravity, UA: >=1.03 — AB (ref 1.005–1.030)
Urobilinogen, Ur: 0.2 mg/dL (ref 0.2–1.0)
pH, UA: 5 (ref 5.0–7.5)

## 2018-03-27 LAB — MICROSCOPIC EXAMINATION

## 2018-04-03 ENCOUNTER — Other Ambulatory Visit: Payer: Self-pay | Admitting: Nurse Practitioner

## 2018-04-04 LAB — CBC
Hematocrit: 31.3 % — ABNORMAL LOW (ref 34.0–46.6)
Hemoglobin: 10.2 g/dL — ABNORMAL LOW (ref 11.1–15.9)
MCH: 29.3 pg (ref 26.6–33.0)
MCHC: 32.6 g/dL (ref 31.5–35.7)
MCV: 90 fL (ref 79–97)
Platelets: 336 10*3/uL (ref 150–450)
RBC: 3.48 x10E6/uL — ABNORMAL LOW (ref 3.77–5.28)
RDW: 13 % (ref 12.3–15.4)
WBC: 5.5 10*3/uL (ref 3.4–10.8)

## 2018-04-04 LAB — LIPID PANEL W/O CHOL/HDL RATIO
Cholesterol, Total: 225 mg/dL — ABNORMAL HIGH (ref 100–199)
HDL: 59 mg/dL (ref >39)
LDL Calculated: 159 mg/dL — ABNORMAL HIGH (ref 0–99)
Triglycerides: 37 mg/dL (ref 0–149)
VLDL Cholesterol Cal: 7 mg/dL (ref 5–40)

## 2018-04-04 LAB — IRON AND TIBC
Iron Saturation: 29 % (ref 15–55)
Iron: 114 ug/dL (ref 27–159)
Total Iron Binding Capacity: 390 ug/dL (ref 250–450)
UIBC: 276 ug/dL (ref 131–425)

## 2018-04-04 LAB — VITAMIN D 25 HYDROXY (VIT D DEFICIENCY, FRACTURES): Vit D, 25-Hydroxy: 25 ng/mL — ABNORMAL LOW (ref 30.0–100.0)

## 2018-04-04 LAB — T3: T3, Total: 99 ng/dL (ref 71–180)

## 2018-04-04 LAB — COMPREHENSIVE METABOLIC PANEL
ALT: 8 IU/L (ref 0–32)
AST: 16 IU/L (ref 0–40)
Albumin/Globulin Ratio: 1.6 (ref 1.2–2.2)
Albumin: 4.1 g/dL (ref 3.5–5.5)
Alkaline Phosphatase: 41 IU/L (ref 39–117)
BUN/Creatinine Ratio: 20 (ref 9–23)
BUN: 14 mg/dL (ref 6–20)
Bilirubin Total: 0.4 mg/dL (ref 0.0–1.2)
CO2: 21 mmol/L (ref 20–29)
Calcium: 8.9 mg/dL (ref 8.7–10.2)
Chloride: 102 mmol/L (ref 96–106)
Creatinine, Ser: 0.71 mg/dL (ref 0.57–1.00)
GFR calc Af Amer: 126 mL/min/{1.73_m2} (ref >59)
GFR calc non Af Amer: 109 mL/min/{1.73_m2} (ref >59)
Globulin, Total: 2.6 g/dL (ref 1.5–4.5)
Glucose: 83 mg/dL (ref 65–99)
Potassium: 4.1 mmol/L (ref 3.5–5.2)
Sodium: 137 mmol/L (ref 134–144)
Total Protein: 6.7 g/dL (ref 6.0–8.5)

## 2018-04-04 LAB — TSH: TSH: 2.08 u[IU]/mL (ref 0.450–4.500)

## 2018-04-04 LAB — FERRITIN: Ferritin: 13 ng/mL — ABNORMAL LOW (ref 15–150)

## 2018-04-04 LAB — T4, FREE: Free T4: 1.36 ng/dL (ref 0.82–1.77)

## 2018-04-04 LAB — B12 AND FOLATE PANEL
Folate: 12.1 ng/mL (ref >3.0)
Vitamin B-12: 914 pg/mL (ref 232–1245)

## 2018-04-05 DIAGNOSIS — E559 Vitamin D deficiency, unspecified: Secondary | ICD-10-CM | POA: Insufficient documentation

## 2018-04-05 DIAGNOSIS — Z23 Encounter for immunization: Secondary | ICD-10-CM | POA: Insufficient documentation

## 2018-04-05 DIAGNOSIS — Z0001 Encounter for general adult medical examination with abnormal findings: Secondary | ICD-10-CM | POA: Insufficient documentation

## 2018-04-05 DIAGNOSIS — L309 Dermatitis, unspecified: Secondary | ICD-10-CM | POA: Insufficient documentation

## 2018-04-05 DIAGNOSIS — E538 Deficiency of other specified B group vitamins: Secondary | ICD-10-CM | POA: Insufficient documentation

## 2018-05-07 ENCOUNTER — Ambulatory Visit: Payer: Self-pay | Admitting: Nurse Practitioner

## 2018-07-08 HISTORY — PX: TUBAL LIGATION: SHX77

## 2018-08-04 LAB — OB RESULTS CONSOLE HIV ANTIBODY (ROUTINE TESTING): HIV: NONREACTIVE

## 2018-08-04 LAB — OB RESULTS CONSOLE VARICELLA ZOSTER ANTIBODY, IGG: Varicella: IMMUNE

## 2018-08-04 LAB — OB RESULTS CONSOLE RPR: RPR: NONREACTIVE

## 2018-08-04 LAB — OB RESULTS CONSOLE RUBELLA ANTIBODY, IGM: Rubella: IMMUNE

## 2018-08-04 LAB — OB RESULTS CONSOLE HEPATITIS B SURFACE ANTIGEN: Hepatitis B Surface Ag: NEGATIVE

## 2018-09-06 DIAGNOSIS — O24419 Gestational diabetes mellitus in pregnancy, unspecified control: Secondary | ICD-10-CM

## 2018-09-06 HISTORY — DX: Gestational diabetes mellitus in pregnancy, unspecified control: O24.419

## 2018-12-21 ENCOUNTER — Other Ambulatory Visit: Payer: Self-pay

## 2018-12-22 ENCOUNTER — Telehealth: Payer: Self-pay | Admitting: *Deleted

## 2018-12-22 ENCOUNTER — Encounter: Payer: Self-pay | Admitting: Oncology

## 2018-12-22 ENCOUNTER — Inpatient Hospital Stay: Payer: Medicaid Other | Attending: Oncology | Admitting: Oncology

## 2018-12-22 DIAGNOSIS — D508 Other iron deficiency anemias: Secondary | ICD-10-CM | POA: Diagnosis not present

## 2018-12-22 DIAGNOSIS — Z79899 Other long term (current) drug therapy: Secondary | ICD-10-CM | POA: Insufficient documentation

## 2018-12-22 DIAGNOSIS — R5383 Other fatigue: Secondary | ICD-10-CM

## 2018-12-22 DIAGNOSIS — Z3493 Encounter for supervision of normal pregnancy, unspecified, third trimester: Secondary | ICD-10-CM | POA: Insufficient documentation

## 2018-12-22 DIAGNOSIS — D72829 Elevated white blood cell count, unspecified: Secondary | ICD-10-CM

## 2018-12-22 DIAGNOSIS — O99013 Anemia complicating pregnancy, third trimester: Secondary | ICD-10-CM

## 2018-12-22 DIAGNOSIS — D509 Iron deficiency anemia, unspecified: Secondary | ICD-10-CM

## 2018-12-22 NOTE — Progress Notes (Signed)
HEMATOLOGY-ONCOLOGY TeleHEALTH VISIT INITIAL CONSULTATION  I connected with Angie Lamb on 12/22/18 at  9:30 AM EDT by video enabled telemedicine visit and verified that I am speaking with the correct person using two identifiers. I discussed the limitations, risks, security and privacy concerns of performing an evaluation and management service by telemedicine and the availability of in-person appointments. I also discussed with the patient that there may be a patient responsible charge related to this service. The patient expressed understanding and agreed to proceed.   Other persons participating in the visit and their role in the encounter:  Geraldine Solar, [place], check in patient   Janeann Merl, RN, check in patient.   Patient's location: Home  Provider's location: Home office  Referring provider: Lisette Grinder, CNM  Chief Complaint: Initial consultation for anemia in pregnancy.  HISTORY OF PRESENT ILLNESS Angie Lamb is a 38 y.o. female who was seen in consultation at the request of Lisette Grinder, CNM for evaluation of anemia in pregnancy. Patient currently in her third trimester, [redacted] weeks pregnant. Reports feeling fatigued. Fatigue: reports worsening fatigue. Chronic onset, perisistent, no aggravating or improving factors, no associated symptoms.  Has a history of iron deficiency in the past. She has been taking oral iron supplementation.  She could not tolerate prescribed oral iron supplementation due to GI side effects.  She bought over-the-counter vitamin C has been taking 1 tablet daily since May. Follows up with [facility] OB/GYN Reviewed medical records.  Care everywhere. Blood work on 11/12/2018 shows hemoglobin 9.6, MCV 91.7.  WBC 13.2 Platelet counts 2 58,000.  Differential shows neutrophilia, decreased monocyte percentage 3.4. Patient starts oral iron supplementation.  She continues to feel very tired and fatigued. RepeatCBC on 12/16/2018 showed further  worsening of hemoglobin to 8.8.  She had gestational diabetes. Review of Systems  Constitutional: Positive for fatigue. Negative for appetite change, chills and fever.  HENT:   Negative for hearing loss and voice change.   Eyes: Negative for eye problems.  Respiratory: Negative for chest tightness and cough.   Cardiovascular: Negative for chest pain.  Gastrointestinal: Negative for abdominal distention, abdominal pain and blood in stool.  Endocrine: Negative for hot flashes.  Genitourinary: Negative for difficulty urinating and frequency.   Musculoskeletal: Negative for arthralgias.  Skin: Negative for itching and rash.  Neurological: Negative for extremity weakness.  Hematological: Negative for adenopathy.  Psychiatric/Behavioral: Negative for confusion.    Past Medical History:  Diagnosis Date  . Gestational diabetes   . Hypertension   . Reflux   . Sleep apnea, obstructive    Past Surgical History:  Procedure Laterality Date  . LAPAROSCOPIC GASTRIC SLEEVE RESECTION      Family History  Problem Relation Age of Onset  . Diabetes Paternal Grandmother   . Thyroid disease Mother   . Thyroid disease Father     Social History   Socioeconomic History  . Marital status: Single    Spouse name: Not on file  . Number of children: Not on file  . Years of education: Not on file  . Highest education level: Not on file  Occupational History  . Not on file  Social Needs  . Financial resource strain: Not on file  . Food insecurity    Worry: Not on file    Inability: Not on file  . Transportation needs    Medical: Not on file    Non-medical: Not on file  Tobacco Use  . Smoking status: Never Smoker  . Smokeless  tobacco: Never Used  Substance and Sexual Activity  . Alcohol use: Yes    Comment: Occasionally every few months   . Drug use: No  . Sexual activity: Not on file  Lifestyle  . Physical activity    Days per week: Not on file    Minutes per session: Not on file  .  Stress: Not on file  Relationships  . Social Herbalist on phone: Not on file    Gets together: Not on file    Attends religious service: Not on file    Active member of club or organization: Not on file    Attends meetings of clubs or organizations: Not on file    Relationship status: Not on file  . Intimate partner violence    Fear of current or ex partner: Not on file    Emotionally abused: Not on file    Physically abused: Not on file    Forced sexual activity: Not on file  Other Topics Concern  . Not on file  Social History Narrative  . Not on file    Current Outpatient Medications on File Prior to Visit  Medication Sig Dispense Refill  . b complex vitamins capsule Take by mouth.    . Ferrous Sulfate (IRON) 325 (65 Fe) MG TABS Take by mouth.    . Multiple Vitamin (MULTI-VITAMINS) TABS Take by mouth.    Marland Kitchen NIFEdipine (PROCARDIA-XL/NIFEDICAL-XL) 30 MG 24 hr tablet Take 30 mg by mouth daily.    . Prenatal Vit-Fe Fumarate-FA (PNV PRENATAL PLUS MULTIVITAMIN) 27-1 MG TABS Take by mouth.    . Vitamin D, Cholecalciferol, 25 MCG (1000 UT) TABS Take 1 tablet by mouth daily.    . calcium-vitamin D (SM CALCIUM 500/VITAMIN D3) 500-400 MG-UNIT tablet Take by mouth.    . doxycycline (VIBRA-TABS) 100 MG tablet Take 1 tablet (100 mg total) by mouth 2 (two) times daily. (Patient not taking: Reported on 12/22/2018) 20 tablet 0  . fluconazole (DIFLUCAN) 150 MG tablet Take 1 tablet po once. May repeat dose in 3 days as needed for persistent symptoms. (Patient not taking: Reported on 12/22/2018) 3 tablet 1  . Magnesium Ascorbate POWD     . phentermine (ADIPEX-P) 37.5 MG tablet Take 1 tablet (37.5 mg total) by mouth daily before breakfast. (Patient not taking: Reported on 12/22/2018) 30 tablet 2  . triamcinolone cream (KENALOG) 0.1 % Apply 1 application topically 2 (two) times daily. (Patient not taking: Reported on 12/22/2018) 60 g 2   No current facility-administered medications on file prior  to visit.     Allergies  Allergen Reactions  . Nsaids Other (See Comments)       Observations/Objective: There were no vitals filed for this visit. There is no height or weight on file to calculate BMI.  Physical Exam  Constitutional: She is oriented to person, place, and time. No distress.  HENT:  Head: Normocephalic and atraumatic.  Pulmonary/Chest: Effort normal.  Musculoskeletal: Normal range of motion.  Neurological: She is alert and oriented to person, place, and time.  Psychiatric: Affect normal.    I have personally reviewed below laboratory results.  CBC    Component Value Date/Time   WBC 5.5 04/03/2018 0746   WBC 6.9 02/02/2016 1403   RBC 3.48 (L) 04/03/2018 0746   RBC 3.57 (L) 02/02/2016 1403   RBC 3.57 (L) 02/02/2016 1403   HGB 10.2 (L) 04/03/2018 0746   HCT 31.3 (L) 04/03/2018 0746   PLT 336 04/03/2018 0746  MCV 90 04/03/2018 0746   MCV 88 11/24/2013 0458   MCH 29.3 04/03/2018 0746   MCH 31.0 02/02/2016 1403   MCHC 32.6 04/03/2018 0746   MCHC 34.0 02/02/2016 1403   RDW 13.0 04/03/2018 0746   RDW 13.3 11/24/2013 0458   LYMPHSABS 2.0 02/02/2016 1403   LYMPHSABS 0.7 (L) 11/24/2013 0458   MONOABS 0.3 02/02/2016 1403   MONOABS 0.4 11/24/2013 0458   EOSABS 0.2 02/02/2016 1403   EOSABS 0.0 11/24/2013 0458   BASOSABS 0.0 02/02/2016 1403   BASOSABS 0.0 11/24/2013 0458    CMP     Component Value Date/Time   NA 137 04/03/2018 0746   NA 133 (L) 11/24/2013 0458   K 4.1 04/03/2018 0746   K 3.8 11/24/2013 0458   CL 102 04/03/2018 0746   CL 104 11/24/2013 0458   CO2 21 04/03/2018 0746   CO2 22 11/24/2013 0458   GLUCOSE 83 04/03/2018 0746   GLUCOSE 83 12/28/2015 1550   GLUCOSE 98 11/24/2013 0458   BUN 14 04/03/2018 0746   BUN 6 (L) 11/24/2013 0458   CREATININE 0.71 04/03/2018 0746   CREATININE 0.69 11/24/2013 0458   CALCIUM 8.9 04/03/2018 0746   CALCIUM 8.7 11/24/2013 0458   PROT 6.7 04/03/2018 0746   PROT 7.7 08/10/2013 1440   ALBUMIN 4.1  04/03/2018 0746   ALBUMIN 3.5 11/24/2013 0458   AST 16 04/03/2018 0746   AST 22 08/10/2013 1440   ALT 8 04/03/2018 0746   ALT 11 (L) 08/10/2013 1440   ALKPHOS 41 04/03/2018 0746   ALKPHOS 51 08/10/2013 1440   BILITOT 0.4 04/03/2018 0746   BILITOT 0.2 08/10/2013 1440   GFRNONAA 109 04/03/2018 0746   GFRNONAA >60 11/24/2013 0458   GFRAA 126 04/03/2018 0746   GFRAA >60 11/24/2013 0458    Assessment and Plan: 1. Iron deficiency anemia, unspecified iron deficiency anemia type   2. Third trimester pregnancy   3. Leukocytosis, unspecified type   4. Other fatigue    Labs are reviewed and discussed with patient. #Anemia in pregnancy, likely a combination of anemia due to iron deficiency as well as gestational anemia. Ferritin level at 9.  Hemoglobin continue to decrease despite taking oral iron supplementation. Patient has a history of gastric sleeve which may limit her absorption ability for oral iron supplementation. Recommend IV iron infusion. Plan IV iron with Venofer 200mg  weekly x 4 doses. Allergy reactions/infusion reaction including anaphylactic reaction discussed with patient. Other side effects include but not limited to high blood pressure, skin rash, weight gain, leg swelling, etc. we will also discussed that occasionally anxiety/nervous mood during infusion potentially can trigger premature labor.   Patient voices understanding and willing to proceed. Fatigue is likely due to anemia.  Hopefully fatigue will get better after iron stores replete Leukocytosis, predominantly neutrophilia, most likely reactive due to pregnancy.  Continue to monitor. Slightly increased immature granulocyte count, which can be reactive vs underlying serious condition ie leukemia. . I  recommend patient to proceed with a CBC with differential and a smear.  Follow Up Instructions: 6 weeks will repeat CBC, CMP, iron, TIBC, ferritin.   I discussed the assessment and treatment plan with the patient. The  patient was provided an opportunity to ask questions and all were answered. The patient agreed with the plan and demonstrated an understanding of the instructions.  The patient was advised to call back or seek an in-person evaluation if the symptoms worsen or if the condition fails to improve as anticipated.  Earlie Server, MD 12/22/2018 3:26 PM

## 2018-12-22 NOTE — Progress Notes (Signed)
Patient contacted for telehealth visit. Pt is [redacted] weeks pregnant, states she is feeling tired all the time.

## 2018-12-25 ENCOUNTER — Inpatient Hospital Stay: Payer: Medicaid Other

## 2018-12-25 ENCOUNTER — Other Ambulatory Visit: Payer: Self-pay

## 2018-12-25 VITALS — BP 94/66 | HR 101 | Temp 98.9°F | Resp 20

## 2018-12-25 DIAGNOSIS — Z3493 Encounter for supervision of normal pregnancy, unspecified, third trimester: Secondary | ICD-10-CM

## 2018-12-25 DIAGNOSIS — Z79899 Other long term (current) drug therapy: Secondary | ICD-10-CM | POA: Diagnosis not present

## 2018-12-25 DIAGNOSIS — D509 Iron deficiency anemia, unspecified: Secondary | ICD-10-CM

## 2018-12-25 DIAGNOSIS — D72829 Elevated white blood cell count, unspecified: Secondary | ICD-10-CM

## 2018-12-25 DIAGNOSIS — D508 Other iron deficiency anemias: Secondary | ICD-10-CM

## 2018-12-25 LAB — CBC WITH DIFFERENTIAL/PLATELET
Abs Immature Granulocytes: 0.06 10*3/uL (ref 0.00–0.07)
Basophils Absolute: 0 10*3/uL (ref 0.0–0.1)
Basophils Relative: 0 %
Eosinophils Absolute: 0.1 10*3/uL (ref 0.0–0.5)
Eosinophils Relative: 1 %
HCT: 28.5 % — ABNORMAL LOW (ref 36.0–46.0)
Hemoglobin: 9 g/dL — ABNORMAL LOW (ref 12.0–15.0)
Immature Granulocytes: 1 %
Lymphocytes Relative: 13 %
Lymphs Abs: 1.6 10*3/uL (ref 0.7–4.0)
MCH: 28.8 pg (ref 26.0–34.0)
MCHC: 31.6 g/dL (ref 30.0–36.0)
MCV: 91.3 fL (ref 80.0–100.0)
Monocytes Absolute: 0.5 10*3/uL (ref 0.1–1.0)
Monocytes Relative: 4 %
Neutro Abs: 9.9 10*3/uL — ABNORMAL HIGH (ref 1.7–7.7)
Neutrophils Relative %: 81 %
Platelets: 217 10*3/uL (ref 150–400)
RBC: 3.12 MIL/uL — ABNORMAL LOW (ref 3.87–5.11)
RDW: 15.1 % (ref 11.5–15.5)
WBC: 12.3 10*3/uL — ABNORMAL HIGH (ref 4.0–10.5)
nRBC: 0 % (ref 0.0–0.2)

## 2018-12-25 LAB — TECHNOLOGIST SMEAR REVIEW

## 2018-12-25 MED ORDER — SODIUM CHLORIDE 0.9 % IV SOLN: 200.0000 mg | INTRAVENOUS | Status: DC

## 2018-12-25 MED ORDER — SODIUM CHLORIDE 0.9 % IV SOLN
Freq: Once | INTRAVENOUS | Status: AC
  Administered 2018-12-25: 14:00:00 via INTRAVENOUS
  Filled 2018-12-25: qty 250

## 2018-12-25 MED ORDER — IRON SUCROSE 20 MG/ML IV SOLN
200.0000 mg | Freq: Once | INTRAVENOUS | Status: AC
  Administered 2018-12-25: 200 mg via INTRAVENOUS
  Filled 2018-12-25: qty 10

## 2019-01-01 ENCOUNTER — Inpatient Hospital Stay: Payer: Medicaid Other

## 2019-01-01 ENCOUNTER — Other Ambulatory Visit: Payer: Self-pay

## 2019-01-01 VITALS — BP 103/71 | HR 100 | Temp 98.1°F | Resp 19

## 2019-01-01 DIAGNOSIS — D508 Other iron deficiency anemias: Secondary | ICD-10-CM

## 2019-01-01 DIAGNOSIS — D509 Iron deficiency anemia, unspecified: Secondary | ICD-10-CM | POA: Diagnosis not present

## 2019-01-01 MED ORDER — IRON SUCROSE 20 MG/ML IV SOLN
200.0000 mg | Freq: Once | INTRAVENOUS | Status: AC
  Administered 2019-01-01: 200 mg via INTRAVENOUS
  Filled 2019-01-01: qty 10

## 2019-01-01 MED ORDER — SODIUM CHLORIDE 0.9 % IV SOLN
Freq: Once | INTRAVENOUS | Status: AC
  Administered 2019-01-01: 14:00:00 via INTRAVENOUS
  Filled 2019-01-01: qty 250

## 2019-01-06 ENCOUNTER — Other Ambulatory Visit: Payer: Self-pay

## 2019-01-07 ENCOUNTER — Inpatient Hospital Stay: Payer: Medicaid Other | Attending: Oncology

## 2019-01-07 ENCOUNTER — Other Ambulatory Visit: Payer: Self-pay

## 2019-01-07 VITALS — BP 104/67 | HR 99 | Temp 97.6°F | Resp 20

## 2019-01-07 DIAGNOSIS — D508 Other iron deficiency anemias: Secondary | ICD-10-CM | POA: Insufficient documentation

## 2019-01-07 DIAGNOSIS — O99013 Anemia complicating pregnancy, third trimester: Secondary | ICD-10-CM | POA: Insufficient documentation

## 2019-01-07 DIAGNOSIS — D72829 Elevated white blood cell count, unspecified: Secondary | ICD-10-CM | POA: Diagnosis not present

## 2019-01-07 MED ORDER — IRON SUCROSE 20 MG/ML IV SOLN
200.0000 mg | Freq: Once | INTRAVENOUS | Status: AC
  Administered 2019-01-07: 200 mg via INTRAVENOUS
  Filled 2019-01-07: qty 10

## 2019-01-07 MED ORDER — SODIUM CHLORIDE 0.9 % IV SOLN
Freq: Once | INTRAVENOUS | Status: AC
  Administered 2019-01-07: 15:00:00 via INTRAVENOUS
  Filled 2019-01-07: qty 250

## 2019-01-14 ENCOUNTER — Other Ambulatory Visit: Payer: Self-pay

## 2019-01-15 ENCOUNTER — Inpatient Hospital Stay: Payer: Medicaid Other

## 2019-01-15 ENCOUNTER — Other Ambulatory Visit: Payer: Self-pay

## 2019-01-15 DIAGNOSIS — D508 Other iron deficiency anemias: Secondary | ICD-10-CM

## 2019-01-15 DIAGNOSIS — O99013 Anemia complicating pregnancy, third trimester: Secondary | ICD-10-CM | POA: Diagnosis not present

## 2019-01-15 MED ORDER — IRON SUCROSE 20 MG/ML IV SOLN
200.0000 mg | Freq: Once | INTRAVENOUS | Status: AC
  Administered 2019-01-15: 200 mg via INTRAVENOUS
  Filled 2019-01-15: qty 10

## 2019-01-15 MED ORDER — SODIUM CHLORIDE 0.9 % IV SOLN
Freq: Once | INTRAVENOUS | Status: AC
  Administered 2019-01-15: 15:00:00 via INTRAVENOUS
  Filled 2019-01-15: qty 250

## 2019-02-01 ENCOUNTER — Inpatient Hospital Stay: Payer: Medicaid Other

## 2019-02-01 ENCOUNTER — Other Ambulatory Visit: Payer: Self-pay

## 2019-02-01 DIAGNOSIS — D509 Iron deficiency anemia, unspecified: Secondary | ICD-10-CM

## 2019-02-01 DIAGNOSIS — O99013 Anemia complicating pregnancy, third trimester: Secondary | ICD-10-CM | POA: Diagnosis not present

## 2019-02-01 LAB — COMPREHENSIVE METABOLIC PANEL
ALT: 7 U/L (ref 0–44)
AST: 17 U/L (ref 15–41)
Albumin: 2.9 g/dL — ABNORMAL LOW (ref 3.5–5.0)
Alkaline Phosphatase: 75 U/L (ref 38–126)
Anion gap: 9 (ref 5–15)
BUN: 9 mg/dL (ref 6–20)
CO2: 23 mmol/L (ref 22–32)
Calcium: 8.8 mg/dL — ABNORMAL LOW (ref 8.9–10.3)
Chloride: 103 mmol/L (ref 98–111)
Creatinine, Ser: 0.66 mg/dL (ref 0.44–1.00)
GFR calc Af Amer: >60 mL/min (ref >60)
GFR calc non Af Amer: >60 mL/min (ref >60)
Glucose, Bld: 88 mg/dL (ref 70–99)
Potassium: 4.2 mmol/L (ref 3.5–5.1)
Sodium: 135 mmol/L (ref 135–145)
Total Bilirubin: 0.5 mg/dL (ref 0.3–1.2)
Total Protein: 6.8 g/dL (ref 6.5–8.1)

## 2019-02-01 LAB — CBC WITH DIFFERENTIAL/PLATELET
Abs Immature Granulocytes: 0.09 10*3/uL — ABNORMAL HIGH (ref 0.00–0.07)
Basophils Absolute: 0 10*3/uL (ref 0.0–0.1)
Basophils Relative: 0 %
Eosinophils Absolute: 0 10*3/uL (ref 0.0–0.5)
Eosinophils Relative: 0 %
HCT: 31.7 % — ABNORMAL LOW (ref 36.0–46.0)
Hemoglobin: 9.9 g/dL — ABNORMAL LOW (ref 12.0–15.0)
Immature Granulocytes: 1 %
Lymphocytes Relative: 12 %
Lymphs Abs: 0.8 10*3/uL (ref 0.7–4.0)
MCH: 29.1 pg (ref 26.0–34.0)
MCHC: 31.2 g/dL (ref 30.0–36.0)
MCV: 93.2 fL (ref 80.0–100.0)
Monocytes Absolute: 0.3 10*3/uL (ref 0.1–1.0)
Monocytes Relative: 5 %
Neutro Abs: 5.5 10*3/uL (ref 1.7–7.7)
Neutrophils Relative %: 82 %
Platelets: 171 10*3/uL (ref 150–400)
RBC: 3.4 MIL/uL — ABNORMAL LOW (ref 3.87–5.11)
RDW: 16.4 % — ABNORMAL HIGH (ref 11.5–15.5)
WBC: 6.7 10*3/uL (ref 4.0–10.5)
nRBC: 0 % (ref 0.0–0.2)

## 2019-02-01 LAB — IRON AND TIBC
Iron: 71 ug/dL (ref 28–170)
Saturation Ratios: 15 % (ref 10.4–31.8)
TIBC: 475 ug/dL — ABNORMAL HIGH (ref 250–450)
UIBC: 404 ug/dL

## 2019-02-01 LAB — FERRITIN: Ferritin: 92 ng/mL (ref 11–307)

## 2019-02-02 ENCOUNTER — Other Ambulatory Visit: Payer: Self-pay

## 2019-02-02 ENCOUNTER — Observation Stay
Admission: EM | Admit: 2019-02-02 | Discharge: 2019-02-02 | Disposition: A | Discharge status: Home / Self Care | Payer: Medicaid Other | Attending: Obstetrics and Gynecology | Admitting: Obstetrics and Gynecology

## 2019-02-02 DIAGNOSIS — U071 COVID-19: Secondary | ICD-10-CM | POA: Diagnosis not present

## 2019-02-02 DIAGNOSIS — G4733 Obstructive sleep apnea (adult) (pediatric): Secondary | ICD-10-CM | POA: Diagnosis not present

## 2019-02-02 DIAGNOSIS — O09513 Supervision of elderly primigravida, third trimester: Secondary | ICD-10-CM | POA: Insufficient documentation

## 2019-02-02 DIAGNOSIS — O163 Unspecified maternal hypertension, third trimester: Secondary | ICD-10-CM | POA: Insufficient documentation

## 2019-02-02 DIAGNOSIS — Z3A35 35 weeks gestation of pregnancy: Secondary | ICD-10-CM | POA: Insufficient documentation

## 2019-02-02 DIAGNOSIS — D259 Leiomyoma of uterus, unspecified: Secondary | ICD-10-CM | POA: Diagnosis not present

## 2019-02-02 DIAGNOSIS — K219 Gastro-esophageal reflux disease without esophagitis: Secondary | ICD-10-CM | POA: Diagnosis not present

## 2019-02-02 DIAGNOSIS — O99613 Diseases of the digestive system complicating pregnancy, third trimester: Secondary | ICD-10-CM | POA: Insufficient documentation

## 2019-02-02 DIAGNOSIS — O98513 Other viral diseases complicating pregnancy, third trimester: Secondary | ICD-10-CM | POA: Insufficient documentation

## 2019-02-02 DIAGNOSIS — Z886 Allergy status to analgesic agent status: Secondary | ICD-10-CM | POA: Insufficient documentation

## 2019-02-02 DIAGNOSIS — O99353 Diseases of the nervous system complicating pregnancy, third trimester: Secondary | ICD-10-CM | POA: Diagnosis not present

## 2019-02-02 DIAGNOSIS — O24913 Unspecified diabetes mellitus in pregnancy, third trimester: Secondary | ICD-10-CM | POA: Insufficient documentation

## 2019-02-02 DIAGNOSIS — Z833 Family history of diabetes mellitus: Secondary | ICD-10-CM | POA: Diagnosis not present

## 2019-02-02 DIAGNOSIS — Z9884 Bariatric surgery status: Secondary | ICD-10-CM | POA: Insufficient documentation

## 2019-02-02 DIAGNOSIS — Z79899 Other long term (current) drug therapy: Secondary | ICD-10-CM | POA: Insufficient documentation

## 2019-02-02 DIAGNOSIS — O368330 Maternal care for abnormalities of the fetal heart rate or rhythm, third trimester, not applicable or unspecified: Secondary | ICD-10-CM | POA: Diagnosis not present

## 2019-02-02 LAB — CBC WITH DIFFERENTIAL/PLATELET
Abs Immature Granulocytes: 0.08 10*3/uL — ABNORMAL HIGH (ref 0.00–0.07)
Basophils Absolute: 0 10*3/uL (ref 0.0–0.1)
Basophils Relative: 0 %
Eosinophils Absolute: 0 10*3/uL (ref 0.0–0.5)
Eosinophils Relative: 0 %
HCT: 28.2 % — ABNORMAL LOW (ref 36.0–46.0)
Hemoglobin: 9 g/dL — ABNORMAL LOW (ref 12.0–15.0)
Immature Granulocytes: 1 %
Lymphocytes Relative: 11 %
Lymphs Abs: 0.8 10*3/uL (ref 0.7–4.0)
MCH: 29.7 pg (ref 26.0–34.0)
MCHC: 31.9 g/dL (ref 30.0–36.0)
MCV: 93.1 fL (ref 80.0–100.0)
Monocytes Absolute: 0.3 10*3/uL (ref 0.1–1.0)
Monocytes Relative: 4 %
Neutro Abs: 5.6 10*3/uL (ref 1.7–7.7)
Neutrophils Relative %: 84 %
Platelets: 174 10*3/uL (ref 150–400)
RBC: 3.03 MIL/uL — ABNORMAL LOW (ref 3.87–5.11)
RDW: 16.4 % — ABNORMAL HIGH (ref 11.5–15.5)
WBC: 6.8 10*3/uL (ref 4.0–10.5)
nRBC: 0 % (ref 0.0–0.2)

## 2019-02-02 LAB — URINALYSIS, ROUTINE W REFLEX MICROSCOPIC
Bacteria, UA: NONE SEEN
Bilirubin Urine: NEGATIVE
Glucose, UA: 50 mg/dL — AB
Hgb urine dipstick: NEGATIVE
Ketones, ur: 5 mg/dL — AB
Leukocytes,Ua: NEGATIVE
Nitrite: NEGATIVE
Protein, ur: 30 mg/dL — AB
Specific Gravity, Urine: 1.029 (ref 1.005–1.030)
pH: 6 (ref 5.0–8.0)

## 2019-02-02 LAB — COMPREHENSIVE METABOLIC PANEL
ALT: 6 U/L (ref 0–44)
AST: 18 U/L (ref 15–41)
Albumin: 2.5 g/dL — ABNORMAL LOW (ref 3.5–5.0)
Alkaline Phosphatase: 69 U/L (ref 38–126)
Anion gap: 7 (ref 5–15)
BUN: 10 mg/dL (ref 6–20)
CO2: 20 mmol/L — ABNORMAL LOW (ref 22–32)
Calcium: 8.1 mg/dL — ABNORMAL LOW (ref 8.9–10.3)
Chloride: 107 mmol/L (ref 98–111)
Creatinine, Ser: 0.61 mg/dL (ref 0.44–1.00)
GFR calc Af Amer: >60 mL/min (ref >60)
GFR calc non Af Amer: >60 mL/min (ref >60)
Glucose, Bld: 112 mg/dL — ABNORMAL HIGH (ref 70–99)
Potassium: 3.6 mmol/L (ref 3.5–5.1)
Sodium: 134 mmol/L — ABNORMAL LOW (ref 135–145)
Total Bilirubin: 0.4 mg/dL (ref 0.3–1.2)
Total Protein: 6 g/dL — ABNORMAL LOW (ref 6.5–8.1)

## 2019-02-02 LAB — TSH: TSH: 1.222 u[IU]/mL (ref 0.350–4.500)

## 2019-02-02 LAB — SARS CORONAVIRUS 2 BY RT PCR (HOSPITAL ORDER, PERFORMED IN ~~LOC~~ HOSPITAL LAB): SARS Coronavirus 2: POSITIVE — AB

## 2019-02-02 MED ORDER — LACTATED RINGERS IV SOLN
INTRAVENOUS | Status: DC
  Administered 2019-02-02: 18:00:00 via INTRAVENOUS

## 2019-02-02 MED ORDER — ACETAMINOPHEN 500 MG PO TABS
1000.0000 mg | ORAL_TABLET | Freq: Three times a day (TID) | ORAL | Status: DC | PRN
  Administered 2019-02-02: 1000 mg via ORAL
  Filled 2019-02-02: qty 2

## 2019-02-02 NOTE — Discharge Summary (Signed)
Angie Lamb is a 38 y.o. female. She is at [redacted]w[redacted]d gestation. Patient's last menstrual period was 06/01/2018 (exact date). Estimated Date of Delivery: 03/08/19  Prenatal care site: [facility]   Current pregnancy complicated by:  Anemia- seen by Hematology, has received iron infusions  DIabetes- dx at 14wks, GDM vs pre-gestational CHTN- procardia 30mg  XL daily Prepregnancy BMI 36.9, hx bariatric surgery Fibroids-  1) right fundal=8.5cm;  2) right lateral=8.7cm Advanced Maternal age  Chief complaint: sent from office for monitoring due to indeterminate tracing and maternal and fetal tachycardia.   Location: n/a Onset/timing: several weeks Duration: unknkown Quality: unknown Severity: n/a Aggravating or alleviating conditions: denies exposure Associated signs/symptoms: "feels warm" Context: noted to have maternal and fetal tachycardia  S: Resting comfortably. no CTX, no VB.no LOF,  Active fetal movement.  Denies: HA, visual changes, SOB, or RUQ/epigastric pain  Maternal Medical History:   Past Medical History:  Diagnosis Date  . Gestational diabetes   . Hypertension   . Reflux   . Sleep apnea, obstructive     Past Surgical History:  Procedure Laterality Date  . LAPAROSCOPIC GASTRIC SLEEVE RESECTION      Allergies  Allergen Reactions  . Nsaids Other (See Comments)    Prior to Admission medications   Medication Sig Start Date End Date Taking? Authorizing Provider  calcium-vitamin D (SM CALCIUM 500/VITAMIN D3) 500-400 MG-UNIT tablet Take by mouth.   Yes [provider]  Multiple Vitamin (MULTI-VITAMINS) TABS Take by mouth.   Yes [provider]  NIFEdipine (PROCARDIA-XL/NIFEDICAL-XL) 30 MG 24 hr tablet Take 30 mg by mouth daily. 11/26/18  Yes [provider]  Prenatal Vit-Fe Fumarate-FA (PNV PRENATAL PLUS MULTIVITAMIN) 27-1 MG TABS Take by mouth.   Yes [provider]  Vitamin D, Cholecalciferol, 25 MCG (1000 UT) TABS Take  1 tablet by mouth daily. 11/26/18  Yes [provider]  b complex vitamins capsule Take by mouth.    [provider]  doxycycline (VIBRA-TABS) 100 MG tablet Take 1 tablet (100 mg total) by mouth 2 (two) times daily. Patient not taking: Reported on 12/22/2018 03/02/18   Kendell Bane, NP  Ferrous Sulfate (IRON) 325 (65 Fe) MG TABS Take by mouth.    [provider]  fluconazole (DIFLUCAN) 150 MG tablet Take 1 tablet po once. May repeat dose in 3 days as needed for persistent symptoms. Patient not taking: Reported on 12/22/2018 03/02/18   Kendell Bane, NP  Magnesium Ascorbate POWD     [provider]  phentermine (ADIPEX-P) 37.5 MG tablet Take 1 tablet (37.5 mg total) by mouth daily before breakfast. Patient not taking: Reported on 12/22/2018 01/22/18   Ronnell Freshwater, NP  triamcinolone cream (KENALOG) 0.1 % Apply 1 application topically 2 (two) times daily. Patient not taking: Reported on 12/22/2018 03/26/18   Ronnell Freshwater, NP      Social History: She  reports that she has never smoked. She has never used smokeless tobacco. She reports current alcohol use. She reports that she does not use drugs.  Family History: family history includes Diabetes in her paternal grandmother; Thyroid disease in her father and mother.   Review of Systems: A full review of systems was performed and negative except as noted in the HPI.     O:  BP 96/75   Pulse (!) 121   Temp 97.9 F (36.6 C) (Axillary) Comment (Src): taken by A. Norma Fredrickson, RN  Resp 18   Ht 5\' 1"  (1.549 m)  Wt 88.9 kg   LMP 06/01/2018 (Exact Date)   SpO2 100%   BMI 37.03 kg/m  Results for orders placed or performed during the hospital encounter of 02/02/19 (from the past 48 hour(s))  TSH   Collection Time: 02/01/19 11:25 AM  Result Value Ref Range   TSH 1.222 0.350 - 4.500 uIU/mL  SARS Coronavirus 2 (CEPHEID - Performed in [place] hospital lab), [facility] Order   Collection Time: 02/02/19   8:03 PM   Specimen: Nasopharyngeal Swab  Result Value Ref Range   SARS Coronavirus 2 POSITIVE (A) NEGATIVE  Urinalysis, Routine w reflex microscopic   Collection Time: 02/02/19  8:03 PM  Result Value Ref Range   Color, Urine YELLOW (A) YELLOW   APPearance CLEAR (A) CLEAR   Specific Gravity, Urine 1.029 1.005 - 1.030   pH 6.0 5.0 - 8.0   Glucose, UA 50 (A) NEGATIVE mg/dL   Hgb urine dipstick NEGATIVE NEGATIVE   Bilirubin Urine NEGATIVE NEGATIVE   Ketones, ur 5 (A) NEGATIVE mg/dL   Protein, ur 30 (A) NEGATIVE mg/dL   Nitrite NEGATIVE NEGATIVE   Leukocytes,Ua NEGATIVE NEGATIVE   RBC / HPF 0-5 0 - 5 RBC/hpf   WBC, UA 0-5 0 - 5 WBC/hpf   Bacteria, UA NONE SEEN NONE SEEN   Squamous Epithelial / LPF 6-10 0 - 5   Mucus PRESENT    Ca Oxalate Crys, UA PRESENT   CBC with Differential/Platelet   Collection Time: 02/02/19  8:59 PM  Result Value Ref Range   WBC 6.8 4.0 - 10.5 K/uL   RBC 3.03 (L) 3.87 - 5.11 MIL/uL   Hemoglobin 9.0 (L) 12.0 - 15.0 g/dL   HCT 28.2 (L) 36.0 - 46.0 %   MCV 93.1 80.0 - 100.0 fL   MCH 29.7 26.0 - 34.0 pg   MCHC 31.9 30.0 - 36.0 g/dL   RDW 16.4 (H) 11.5 - 15.5 %   Platelets 174 150 - 400 K/uL   nRBC 0.0 0.0 - 0.2 %   Neutrophils Relative % 84 %   Neutro Abs 5.6 1.7 - 7.7 K/uL   Lymphocytes Relative 11 %   Lymphs Abs 0.8 0.7 - 4.0 K/uL   Monocytes Relative 4 %   Monocytes Absolute 0.3 0.1 - 1.0 K/uL   Eosinophils Relative 0 %   Eosinophils Absolute 0.0 0.0 - 0.5 K/uL   Basophils Relative 0 %   Basophils Absolute 0.0 0.0 - 0.1 K/uL   Immature Granulocytes 1 %   Abs Immature Granulocytes 0.08 (H) 0.00 - 0.07 K/uL  Comprehensive metabolic panel   Collection Time: 02/02/19  8:59 PM  Result Value Ref Range   Sodium 134 (L) 135 - 145 mmol/L   Potassium 3.6 3.5 - 5.1 mmol/L   Chloride 107 98 - 111 mmol/L   CO2 20 (L) 22 - 32 mmol/L   Glucose, Bld 112 (H) 70 - 99 mg/dL   BUN 10 6 - 20 mg/dL   Creatinine, Ser 0.61 0.44 - 1.00 mg/dL   Calcium 8.1 (L) 8.9 -  10.3 mg/dL   Total Protein 6.0 (L) 6.5 - 8.1 g/dL   Albumin 2.5 (L) 3.5 - 5.0 g/dL   AST 18 15 - 41 U/L   ALT 6 0 - 44 U/L   Alkaline Phosphatase 69 38 - 126 U/L   Total Bilirubin 0.4 0.3 - 1.2 mg/dL   GFR calc non Af Amer >60 >60 mL/min   GFR calc Af Amer >60 >60 mL/min  Anion gap 7 5 - 15  Results for orders placed or performed in visit on 02/01/19 (from the past 48 hour(s))  Iron and TIBC   Collection Time: 02/01/19 11:25 AM  Result Value Ref Range   Iron 71 28 - 170 ug/dL   TIBC 475 (H) 250 - 450 ug/dL   Saturation Ratios 15 10.4 - 31.8 %   UIBC 404 ug/dL  Ferritin   Collection Time: 02/01/19 11:25 AM  Result Value Ref Range   Ferritin 92 11 - 307 ng/mL  Comprehensive metabolic panel   Collection Time: 02/01/19 11:25 AM  Result Value Ref Range   Sodium 135 135 - 145 mmol/L   Potassium 4.2 3.5 - 5.1 mmol/L   Chloride 103 98 - 111 mmol/L   CO2 23 22 - 32 mmol/L   Glucose, Bld 88 70 - 99 mg/dL   BUN 9 6 - 20 mg/dL   Creatinine, Ser 0.66 0.44 - 1.00 mg/dL   Calcium 8.8 (L) 8.9 - 10.3 mg/dL   Total Protein 6.8 6.5 - 8.1 g/dL   Albumin 2.9 (L) 3.5 - 5.0 g/dL   AST 17 15 - 41 U/L   ALT 7 0 - 44 U/L   Alkaline Phosphatase 75 38 - 126 U/L   Total Bilirubin 0.5 0.3 - 1.2 mg/dL   GFR calc non Af Amer >60 >60 mL/min   GFR calc Af Amer >60 >60 mL/min   Anion gap 9 5 - 15  CBC with Differential/Platelet   Collection Time: 02/01/19 11:25 AM  Result Value Ref Range   WBC 6.7 4.0 - 10.5 K/uL   RBC 3.40 (L) 3.87 - 5.11 MIL/uL   Hemoglobin 9.9 (L) 12.0 - 15.0 g/dL   HCT 31.7 (L) 36.0 - 46.0 %   MCV 93.2 80.0 - 100.0 fL   MCH 29.1 26.0 - 34.0 pg   MCHC 31.2 30.0 - 36.0 g/dL   RDW 16.4 (H) 11.5 - 15.5 %   Platelets 171 150 - 400 K/uL   nRBC 0.0 0.0 - 0.2 %   Neutrophils Relative % 82 %   Neutro Abs 5.5 1.7 - 7.7 K/uL   Lymphocytes Relative 12 %   Lymphs Abs 0.8 0.7 - 4.0 K/uL   Monocytes Relative 5 %   Monocytes Absolute 0.3 0.1 - 1.0 K/uL   Eosinophils Relative 0 %    Eosinophils Absolute 0.0 0.0 - 0.5 K/uL   Basophils Relative 0 %   Basophils Absolute 0.0 0.0 - 0.1 K/uL   Immature Granulocytes 1 %   Abs Immature Granulocytes 0.09 (H) 0.00 - 0.07 K/uL     Constitutional: NAD, AAOx3  HE/ENT: extraocular movements grossly intact, moist mucous membranes CV: RRR PULM: nl respiratory effort, CTABL     Abd: gravid, non-tender, non-distended, soft      Ext: Non-tender, Nonedematous   Psych: mood appropriate, speech normal Pelvic: deferred  Fetal  monitoring: Cat I Appropriate for GA Baseline: 150bpm Variability: moderate Accelerations: present x >2 Decelerations absent Time 13mins    A/P: 38 y.o. [redacted]w[redacted]d here for antenatal surveillance for maternal and fetal tachycardia, noted to have fever while in triage, Dx COVID Pos.   Principle Diagnosis:  High risk pregnancy in third trimester   Preterm labor: not present.   Fetal Wellbeing: Reassuring Cat 1 tracing with Reactive NST   Discussed with Dr Leonides Schanz, pt given warning signs to come for preterm labor or for increased severity of COVID. Pt stable for dc home.   Blood  cultures pending.   Pt to have virtual visit on Friday, daily FKCs and pt has home BP cuff. Will consider in-car AFI with portable US next Tuesday per Dr Leonides Schanz.    Francetta Found, CNM 02/03/2019  10:25 PM

## 2019-02-02 NOTE — OB Triage Note (Signed)
Pt sent from OBGYN office for NST.  [id number] [redacted]w[redacted]d, pt denies LOF, VB, and states positive fetal movement. External monitors applied and assessing, initial FHT 160. VS WNL.

## 2019-02-02 NOTE — H&P (Addendum)
Angie Lamb is a 38 y.o. female. She is at [redacted]w[redacted]d gestation. Patient's last menstrual period was 06/01/2018 (exact date). Estimated Date of Delivery: 03/08/19  Prenatal care site: [facility]  Current pregnancy complicated by:  Anemia- seen by Hematology, has received iron infusions  DIabetes- dx at 14wks, GDM vs pre-gestational CHTN- procardia 30mg  XL daily Prepregnancy BMI 36.9, hx bariatric surgery Fibroids-  1) right fundal=8.5cm;  2) right lateral=8.7cm Advanced Maternal age  Chief complaint: sent from office for monitoring due to indeterminate tracing and maternal and fetal tachycardia.   Location: n/a Onset/timing: n/a Duration: n/a Quality: indeterminate NST in office, not tracing fetal continuously Severity: n/a Aggravating or alleviating conditions: CHTN, GDM Associated signs/symptoms: none Context:  S: Resting comfortably. no CTX, no VB.no LOF,  Active fetal movement. Denies: HA, visual changes, SOB, or RUQ/epigastric pain; no palpitations, chest heaviness or increased edema.   Maternal Medical History:   Past Medical History:  Diagnosis Date  . Gestational diabetes   . Hypertension   . Reflux   . Sleep apnea, obstructive     Past Surgical History:  Procedure Laterality Date  . LAPAROSCOPIC GASTRIC SLEEVE RESECTION      Allergies  Allergen Reactions  . Nsaids Other (See Comments)    Prior to Admission medications   Medication Sig Start Date End Date Taking? Authorizing Provider  calcium-vitamin D (SM CALCIUM 500/VITAMIN D3) 500-400 MG-UNIT tablet Take by mouth.   Yes [provider]  Multiple Vitamin (MULTI-VITAMINS) TABS Take by mouth.   Yes [provider]  NIFEdipine (PROCARDIA-XL/NIFEDICAL-XL) 30 MG 24 hr tablet Take 30 mg by mouth daily. 11/26/18  Yes [provider]  Prenatal Vit-Fe Fumarate-FA (PNV PRENATAL PLUS MULTIVITAMIN) 27-1 MG TABS Take by mouth.   Yes [provider]  Vitamin D,  Cholecalciferol, 25 MCG (1000 UT) TABS Take 1 tablet by mouth daily. 11/26/18  Yes [provider]  b complex vitamins capsule Take by mouth.    [provider]  doxycycline (VIBRA-TABS) 100 MG tablet Take 1 tablet (100 mg total) by mouth 2 (two) times daily. Patient not taking: Reported on 12/22/2018 03/02/18   Kendell Bane, NP  Ferrous Sulfate (IRON) 325 (65 Fe) MG TABS Take by mouth.    [provider]  fluconazole (DIFLUCAN) 150 MG tablet Take 1 tablet po once. May repeat dose in 3 days as needed for persistent symptoms. Patient not taking: Reported on 12/22/2018 03/02/18   Kendell Bane, NP  Magnesium Ascorbate POWD     [provider]  phentermine (ADIPEX-P) 37.5 MG tablet Take 1 tablet (37.5 mg total) by mouth daily before breakfast. Patient not taking: Reported on 12/22/2018 01/22/18   Ronnell Freshwater, NP  triamcinolone cream (KENALOG) 0.1 % Apply 1 application topically 2 (two) times daily. Patient not taking: Reported on 12/22/2018 03/26/18   Ronnell Freshwater, NP      Social History: She  reports that she has never smoked. She has never used smokeless tobacco. She reports current alcohol use. She reports that she does not use drugs.  Family History: family history includes Diabetes in her paternal grandmother; Thyroid disease in her father and mother.   Review of Systems: A full review of systems was performed and negative except as noted in the HPI.     O:  BP 96/75   Pulse (!) 121   Temp (!) 100.5 F (38.1 C) (Axillary)   Resp 18   Ht 5\' 1"  (1.549 m)  Wt 88.9 kg   LMP 06/01/2018 (Exact Date)   SpO2 100%   BMI 37.03 kg/m  Results for orders placed or performed during the hospital encounter of 02/02/19 (from the past 48 hour(s))  TSH   Collection Time: 02/01/19 11:25 AM  Result Value Ref Range   TSH 1.222 0.350 - 4.500 uIU/mL  Urinalysis, Routine w reflex microscopic   Collection Time: 02/02/19  8:03 PM  Result Value Ref Range    Color, Urine YELLOW (A) YELLOW   APPearance CLEAR (A) CLEAR   Specific Gravity, Urine 1.029 1.005 - 1.030   pH 6.0 5.0 - 8.0   Glucose, UA 50 (A) NEGATIVE mg/dL   Hgb urine dipstick NEGATIVE NEGATIVE   Bilirubin Urine NEGATIVE NEGATIVE   Ketones, ur 5 (A) NEGATIVE mg/dL   Protein, ur 30 (A) NEGATIVE mg/dL   Nitrite NEGATIVE NEGATIVE   Leukocytes,Ua NEGATIVE NEGATIVE   RBC / HPF 0-5 0 - 5 RBC/hpf   WBC, UA 0-5 0 - 5 WBC/hpf   Bacteria, UA NONE SEEN NONE SEEN   Squamous Epithelial / LPF 6-10 0 - 5   Mucus PRESENT    Ca Oxalate Crys, UA PRESENT   Results for orders placed or performed in visit on 02/01/19 (from the past 48 hour(s))  Iron and TIBC   Collection Time: 02/01/19 11:25 AM  Result Value Ref Range   Iron 71 28 - 170 ug/dL   TIBC 475 (H) 250 - 450 ug/dL   Saturation Ratios 15 10.4 - 31.8 %   UIBC 404 ug/dL  Ferritin   Collection Time: 02/01/19 11:25 AM  Result Value Ref Range   Ferritin 92 11 - 307 ng/mL  Comprehensive metabolic panel   Collection Time: 02/01/19 11:25 AM  Result Value Ref Range   Sodium 135 135 - 145 mmol/L   Potassium 4.2 3.5 - 5.1 mmol/L   Chloride 103 98 - 111 mmol/L   CO2 23 22 - 32 mmol/L   Glucose, Bld 88 70 - 99 mg/dL   BUN 9 6 - 20 mg/dL   Creatinine, Ser 0.66 0.44 - 1.00 mg/dL   Calcium 8.8 (L) 8.9 - 10.3 mg/dL   Total Protein 6.8 6.5 - 8.1 g/dL   Albumin 2.9 (L) 3.5 - 5.0 g/dL   AST 17 15 - 41 U/L   ALT 7 0 - 44 U/L   Alkaline Phosphatase 75 38 - 126 U/L   Total Bilirubin 0.5 0.3 - 1.2 mg/dL   GFR calc non Af Amer >60 >60 mL/min   GFR calc Af Amer >60 >60 mL/min   Anion gap 9 5 - 15  CBC with Differential/Platelet   Collection Time: 02/01/19 11:25 AM  Result Value Ref Range   WBC 6.7 4.0 - 10.5 K/uL   RBC 3.40 (L) 3.87 - 5.11 MIL/uL   Hemoglobin 9.9 (L) 12.0 - 15.0 g/dL   HCT 31.7 (L) 36.0 - 46.0 %   MCV 93.2 80.0 - 100.0 fL   MCH 29.1 26.0 - 34.0 pg   MCHC 31.2 30.0 - 36.0 g/dL   RDW 16.4 (H) 11.5 - 15.5 %   Platelets 171  150 - 400 K/uL   nRBC 0.0 0.0 - 0.2 %   Neutrophils Relative % 82 %   Neutro Abs 5.5 1.7 - 7.7 K/uL   Lymphocytes Relative 12 %   Lymphs Abs 0.8 0.7 - 4.0 K/uL   Monocytes Relative 5 %   Monocytes Absolute 0.3 0.1 - 1.0 K/uL  Eosinophils Relative 0 %   Eosinophils Absolute 0.0 0.0 - 0.5 K/uL   Basophils Relative 0 %   Basophils Absolute 0.0 0.0 - 0.1 K/uL   Immature Granulocytes 1 %   Abs Immature Granulocytes 0.09 (H) 0.00 - 0.07 K/uL     Constitutional: NAD, AAOx3  HE/ENT: extraocular movements grossly intact, moist mucous membranes CV: RRR PULM: nl respiratory effort, CTABL     Abd: gravid, non-tender, non-distended, soft      Ext: Non-tender, Nonedematous   Psych: mood appropriate, speech normal Pelvic: deferred   Fetal  monitoring: Cat I Appropriate for GA Baseline: 155bpm Variability: moderate Accelerations: present x >2 Decelerations absent Time 64mins    A/P: 38 y.o. [redacted]w[redacted]d here for antenatal surveillance for maternal tachycardia and indeterminate NST in office today.   Principle Diagnosis:  High risk pregnancy in third trimester: GDMA1, CHTN, 35wks Maternal fever   Preterm labor: not present.   Fetal Wellbeing: Reassuring Cat 1 tracing with Reactive NST   Maternal tachycardia 115-135bpm but asymptomatic. Review of pulse from office 101-129 at all visits.   Discussed with Dr Leonides Schanz, 12 lead EKG now and TSH added on to labs done yesterday). Both WNL.   INitial maternal temp 99 then 99.7 orally, temp rechecked axillary and noted to be 100.5; pt feels warm to touch.  D/w Dr Leonides Schanz, added blood cultures, CBC, COVID and tylenol now.   Francetta Found, CNM 02/02/2019  9:02 PM

## 2019-02-03 ENCOUNTER — Telehealth: Payer: Self-pay | Admitting: *Deleted

## 2019-02-03 ENCOUNTER — Inpatient Hospital Stay: Payer: Medicaid Other

## 2019-02-03 ENCOUNTER — Inpatient Hospital Stay: Payer: Medicaid Other | Admitting: Oncology

## 2019-02-03 LAB — URINE CULTURE
Culture: <10000 — AB
Special Requests: NORMAL

## 2019-02-03 NOTE — Telephone Encounter (Signed)
FYI.Marland KitchenMarland KitchenMarland KitchenPatient called and stated that she tested positive for COVID19 on 02/02/19  and needed to cancelled her 02/03/19 MD/+/-Venofer appt. However, she was seen here on 02/01/19 and had her labs drawn.

## 2019-02-04 ENCOUNTER — Inpatient Hospital Stay
Admission: EM | Admit: 2019-02-04 | Discharge: 2019-02-07 | DRG: 783 | Disposition: A | Discharge status: Home / Self Care | Payer: Medicaid Other | Attending: Obstetrics and Gynecology | Admitting: Obstetrics and Gynecology

## 2019-02-04 ENCOUNTER — Inpatient Hospital Stay: Payer: Medicaid Other | Admitting: Certified Registered"

## 2019-02-04 ENCOUNTER — Encounter
Admission: EM | Disposition: A | Discharge status: Home / Self Care | Payer: Self-pay | Source: Home / Self Care | Attending: Obstetrics and Gynecology

## 2019-02-04 ENCOUNTER — Other Ambulatory Visit: Payer: Self-pay

## 2019-02-04 DIAGNOSIS — O4700 False labor before 37 completed weeks of gestation, unspecified trimester: Secondary | ICD-10-CM | POA: Diagnosis present

## 2019-02-04 DIAGNOSIS — O479 False labor, unspecified: Secondary | ICD-10-CM | POA: Diagnosis present

## 2019-02-04 DIAGNOSIS — O3413 Maternal care for benign tumor of corpus uteri, third trimester: Secondary | ICD-10-CM | POA: Diagnosis present

## 2019-02-04 DIAGNOSIS — O42919 Preterm premature rupture of membranes, unspecified as to length of time between rupture and onset of labor, unspecified trimester: Secondary | ICD-10-CM | POA: Diagnosis present

## 2019-02-04 DIAGNOSIS — O9852 Other viral diseases complicating childbirth: Secondary | ICD-10-CM | POA: Diagnosis present

## 2019-02-04 DIAGNOSIS — D251 Intramural leiomyoma of uterus: Secondary | ICD-10-CM | POA: Diagnosis present

## 2019-02-04 DIAGNOSIS — O1002 Pre-existing essential hypertension complicating childbirth: Secondary | ICD-10-CM | POA: Diagnosis present

## 2019-02-04 DIAGNOSIS — D649 Anemia, unspecified: Secondary | ICD-10-CM | POA: Diagnosis present

## 2019-02-04 DIAGNOSIS — O4593 Premature separation of placenta, unspecified, third trimester: Secondary | ICD-10-CM | POA: Diagnosis present

## 2019-02-04 DIAGNOSIS — Z3A35 35 weeks gestation of pregnancy: Secondary | ICD-10-CM

## 2019-02-04 DIAGNOSIS — O99844 Bariatric surgery status complicating childbirth: Secondary | ICD-10-CM | POA: Diagnosis present

## 2019-02-04 DIAGNOSIS — O9902 Anemia complicating childbirth: Secondary | ICD-10-CM | POA: Diagnosis present

## 2019-02-04 DIAGNOSIS — U071 COVID-19: Secondary | ICD-10-CM | POA: Diagnosis present

## 2019-02-04 DIAGNOSIS — O42913 Preterm premature rupture of membranes, unspecified as to length of time between rupture and onset of labor, third trimester: Secondary | ICD-10-CM | POA: Diagnosis present

## 2019-02-04 DIAGNOSIS — Z302 Encounter for sterilization: Secondary | ICD-10-CM

## 2019-02-04 DIAGNOSIS — R6339 Other feeding difficulties: Secondary | ICD-10-CM

## 2019-02-04 DIAGNOSIS — R633 Feeding difficulties: Secondary | ICD-10-CM

## 2019-02-04 DIAGNOSIS — O24420 Gestational diabetes mellitus in childbirth, diet controlled: Principal | ICD-10-CM | POA: Diagnosis present

## 2019-02-04 DIAGNOSIS — O26893 Other specified pregnancy related conditions, third trimester: Secondary | ICD-10-CM | POA: Diagnosis present

## 2019-02-04 LAB — CHLAMYDIA/NGC RT PCR (ARMC ONLY)
Chlamydia Tr: NOT DETECTED
N gonorrhoeae: NOT DETECTED

## 2019-02-04 LAB — GROUP B STREP BY PCR: Group B strep by PCR: NEGATIVE

## 2019-02-04 LAB — OB RESULTS CONSOLE GBS: GBS: NEGATIVE

## 2019-02-04 LAB — GLUCOSE, CAPILLARY: Glucose-Capillary: 75 mg/dL (ref 70–99)

## 2019-02-04 LAB — OB RESULTS CONSOLE GC/CHLAMYDIA
Chlamydia: NEGATIVE
Gonorrhea: NEGATIVE

## 2019-02-04 LAB — CBC
HCT: 35.3 % — ABNORMAL LOW (ref 36.0–46.0)
Hemoglobin: 11 g/dL — ABNORMAL LOW (ref 12.0–15.0)
MCH: 29.7 pg (ref 26.0–34.0)
MCHC: 31.2 g/dL (ref 30.0–36.0)
MCV: 95.4 fL (ref 80.0–100.0)
Platelets: 188 10*3/uL (ref 150–400)
RBC: 3.7 MIL/uL — ABNORMAL LOW (ref 3.87–5.11)
RDW: 16.3 % — ABNORMAL HIGH (ref 11.5–15.5)
WBC: 7.8 10*3/uL (ref 4.0–10.5)
nRBC: 0 % (ref 0.0–0.2)

## 2019-02-04 SURGERY — Surgical Case
Anesthesia: Spinal | Site: Abdomen

## 2019-02-04 MED ORDER — SODIUM CHLORIDE 0.9 % IV SOLN
500.0000 mg | Freq: Once | INTRAVENOUS | Status: AC
  Administered 2019-02-04: 500 mg via INTRAVENOUS
  Filled 2019-02-04: qty 500

## 2019-02-04 MED ORDER — LACTATED RINGERS IV SOLN: 500.0000 mL | INTRAVENOUS | Status: DC | PRN

## 2019-02-04 MED ORDER — MISOPROSTOL 25 MCG QUARTER TABLET: 25.0000 ug | ORAL_TABLET | ORAL | Status: DC | PRN

## 2019-02-04 MED ORDER — FENTANYL CITRATE (PF) 100 MCG/2ML IJ SOLN
INTRAMUSCULAR | Status: AC
  Filled 2019-02-04: qty 2

## 2019-02-04 MED ORDER — CEFAZOLIN SODIUM-DEXTROSE 2-4 GM/100ML-% IV SOLN
2.0000 g | INTRAVENOUS | Status: AC
  Administered 2019-02-04: 2 g via INTRAVENOUS
  Filled 2019-02-04: qty 100

## 2019-02-04 MED ORDER — SOD CITRATE-CITRIC ACID 500-334 MG/5ML PO SOLN
30.0000 mL | ORAL | Status: AC
  Administered 2019-02-04: 30 mL via ORAL

## 2019-02-04 MED ORDER — CARBOPROST TROMETHAMINE 250 MCG/ML IM SOLN
INTRAMUSCULAR | Status: AC
  Filled 2019-02-04: qty 1

## 2019-02-04 MED ORDER — FENTANYL CITRATE (PF) 100 MCG/2ML IJ SOLN: 25.0000 ug | INTRAMUSCULAR | Status: DC | PRN

## 2019-02-04 MED ORDER — BUPIVACAINE LIPOSOME 1.3 % IJ SUSP
INTRAMUSCULAR | Status: AC
  Filled 2019-02-04: qty 20

## 2019-02-04 MED ORDER — DEXAMETHASONE SODIUM PHOSPHATE 10 MG/ML IJ SOLN
INTRAMUSCULAR | Status: DC | PRN
  Administered 2019-02-04: 10 mg via INTRAVENOUS

## 2019-02-04 MED ORDER — SOD CITRATE-CITRIC ACID 500-334 MG/5ML PO SOLN
30.0000 mL | ORAL | Status: DC | PRN
  Filled 2019-02-04 (×2): qty 30

## 2019-02-04 MED ORDER — OXYTOCIN BOLUS FROM INFUSION: 500.0000 mL | Freq: Once | INTRAVENOUS | Status: DC

## 2019-02-04 MED ORDER — METHYLERGONOVINE MALEATE 0.2 MG/ML IJ SOLN
INTRAMUSCULAR | Status: AC
  Filled 2019-02-04: qty 1

## 2019-02-04 MED ORDER — OXYTOCIN 40 UNITS IN NORMAL SALINE INFUSION - SIMPLE MED
INTRAVENOUS | Status: AC
  Filled 2019-02-04: qty 1000

## 2019-02-04 MED ORDER — SODIUM CHLORIDE 0.9 % IV SOLN
INTRAVENOUS | Status: DC | PRN
  Administered 2019-02-04: 70 mL

## 2019-02-04 MED ORDER — BUPIVACAINE IN DEXTROSE 0.75-8.25 % IT SOLN
INTRATHECAL | Status: DC | PRN
  Administered 2019-02-04: 1.6 mL via INTRATHECAL

## 2019-02-04 MED ORDER — ONDANSETRON HCL 4 MG/2ML IJ SOLN
INTRAMUSCULAR | Status: AC
  Filled 2019-02-04: qty 2

## 2019-02-04 MED ORDER — ONDANSETRON HCL 4 MG/2ML IJ SOLN
INTRAMUSCULAR | Status: DC | PRN
  Administered 2019-02-04: 4 mg via INTRAVENOUS

## 2019-02-04 MED ORDER — BUPIVACAINE HCL (PF) 0.5 % IJ SOLN
INTRAMUSCULAR | Status: AC
  Filled 2019-02-04: qty 30

## 2019-02-04 MED ORDER — MORPHINE SULFATE (PF) 0.5 MG/ML IJ SOLN
INTRAMUSCULAR | Status: AC
  Filled 2019-02-04: qty 10

## 2019-02-04 MED ORDER — BUTORPHANOL TARTRATE 2 MG/ML IJ SOLN: 1.0000 mg | INTRAMUSCULAR | Status: DC | PRN

## 2019-02-04 MED ORDER — FENTANYL CITRATE (PF) 100 MCG/2ML IJ SOLN
INTRAMUSCULAR | Status: DC | PRN
  Administered 2019-02-04: 15 ug via INTRATHECAL

## 2019-02-04 MED ORDER — LACTATED RINGERS IV SOLN
INTRAVENOUS | Status: DC
  Administered 2019-02-04 – 2019-02-05 (×2): via INTRAVENOUS

## 2019-02-04 MED ORDER — AMMONIA AROMATIC IN INHA
RESPIRATORY_TRACT | Status: AC
  Filled 2019-02-04: qty 10

## 2019-02-04 MED ORDER — ONDANSETRON HCL 4 MG/2ML IJ SOLN: 4.0000 mg | Freq: Once | INTRAMUSCULAR | Status: DC | PRN

## 2019-02-04 MED ORDER — ACETAMINOPHEN 325 MG PO TABS
650.0000 mg | ORAL_TABLET | ORAL | Status: DC | PRN
  Administered 2019-02-04: 650 mg via ORAL
  Filled 2019-02-04: qty 2

## 2019-02-04 MED ORDER — SODIUM CHLORIDE 0.9 % IV SOLN
INTRAVENOUS | Status: DC | PRN
  Administered 2019-02-04: 50 ug/min via INTRAVENOUS

## 2019-02-04 MED ORDER — PHENYLEPHRINE HCL (PRESSORS) 10 MG/ML IV SOLN
INTRAVENOUS | Status: DC | PRN
  Administered 2019-02-04: 200 ug via INTRAVENOUS
  Administered 2019-02-04 (×2): 100 ug via INTRAVENOUS
  Administered 2019-02-04 (×2): 200 ug via INTRAVENOUS
  Administered 2019-02-04: 100 ug via INTRAVENOUS
  Administered 2019-02-04 (×2): 200 ug via INTRAVENOUS
  Administered 2019-02-04: 100 ug via INTRAVENOUS
  Administered 2019-02-04: 200 ug via INTRAVENOUS

## 2019-02-04 MED ORDER — FENTANYL 2.5 MCG/ML W/ROPIVACAINE 0.15% IN NS 100 ML EPIDURAL (ARMC)
EPIDURAL | Status: AC
  Filled 2019-02-04: qty 100

## 2019-02-04 MED ORDER — TERBUTALINE SULFATE 1 MG/ML IJ SOLN
0.2500 mg | Freq: Once | INTRAMUSCULAR | Status: AC
  Administered 2019-02-04: 0.25 mg via SUBCUTANEOUS

## 2019-02-04 MED ORDER — LIDOCAINE HCL (PF) 1 % IJ SOLN
INTRAMUSCULAR | Status: AC
  Filled 2019-02-04: qty 30

## 2019-02-04 MED ORDER — DEXAMETHASONE SODIUM PHOSPHATE 10 MG/ML IJ SOLN
INTRAMUSCULAR | Status: AC
  Filled 2019-02-04: qty 1

## 2019-02-04 MED ORDER — OXYTOCIN 10 UNIT/ML IJ SOLN
INTRAMUSCULAR | Status: AC
  Filled 2019-02-04: qty 2

## 2019-02-04 MED ORDER — LIDOCAINE HCL (PF) 1 % IJ SOLN: 30.0000 mL | INTRAMUSCULAR | Status: DC | PRN

## 2019-02-04 MED ORDER — TERBUTALINE SULFATE 1 MG/ML IJ SOLN
INTRAMUSCULAR | Status: AC
  Filled 2019-02-04: qty 1

## 2019-02-04 MED ORDER — TERBUTALINE SULFATE 1 MG/ML IJ SOLN: 0.2500 mg | Freq: Once | INTRAMUSCULAR | Status: DC | PRN

## 2019-02-04 MED ORDER — EPHEDRINE SULFATE 50 MG/ML IJ SOLN
INTRAMUSCULAR | Status: DC | PRN
  Administered 2019-02-04: 10 mg via INTRAVENOUS
  Administered 2019-02-04: 15 mg via INTRAVENOUS
  Administered 2019-02-04: 10 mg via INTRAVENOUS
  Administered 2019-02-04: 15 mg via INTRAVENOUS

## 2019-02-04 MED ORDER — ONDANSETRON HCL 4 MG/2ML IJ SOLN: 4.0000 mg | Freq: Four times a day (QID) | INTRAMUSCULAR | Status: DC | PRN

## 2019-02-04 MED ORDER — OXYTOCIN 40 UNITS IN NORMAL SALINE INFUSION - SIMPLE MED
1.0000 m[IU]/min | INTRAVENOUS | Status: DC
  Administered 2019-02-04: 250 mL via INTRAVENOUS
  Administered 2019-02-04: 2 m[IU]/min via INTRAVENOUS
  Administered 2019-02-04: 500 mL via INTRAVENOUS

## 2019-02-04 MED ORDER — OXYTOCIN 40 UNITS IN NORMAL SALINE INFUSION - SIMPLE MED
2.5000 [IU]/h | INTRAVENOUS | Status: DC
  Filled 2019-02-04: qty 1000

## 2019-02-04 MED ORDER — SODIUM CHLORIDE (PF) 0.9 % IJ SOLN
INTRAMUSCULAR | Status: AC
  Filled 2019-02-04: qty 50

## 2019-02-04 MED ORDER — BUPIVACAINE HCL (PF) 0.25 % IJ SOLN
INTRAMUSCULAR | Status: DC | PRN
  Administered 2019-02-04: 30 mL

## 2019-02-04 MED ORDER — MORPHINE SULFATE (PF) 0.5 MG/ML IJ SOLN
INTRAMUSCULAR | Status: DC | PRN
  Administered 2019-02-04: .1 mg via INTRATHECAL

## 2019-02-04 MED ORDER — MISOPROSTOL 200 MCG PO TABS
ORAL_TABLET | ORAL | Status: AC
  Filled 2019-02-04: qty 4

## 2019-02-04 SURGICAL SUPPLY — 43 items
BARRIER ADHS 3X4 INTERCEED (GAUZE/BANDAGES/DRESSINGS) ×1 IMPLANT
CANISTER SUCT 3000ML PPV (MISCELLANEOUS) ×3 IMPLANT
CHLORAPREP W/TINT 26 (MISCELLANEOUS) ×4 IMPLANT
COVER WAND RF STERILE (DRAPES) ×2 IMPLANT
DERMABOND ADVANCED (GAUZE/BANDAGES/DRESSINGS) ×1
DERMABOND ADVANCED .7 DNX12 (GAUZE/BANDAGES/DRESSINGS) IMPLANT
DRSG OPSITE POSTOP 4X10 (GAUZE/BANDAGES/DRESSINGS) IMPLANT
DRSG TELFA 3X8 NADH (GAUZE/BANDAGES/DRESSINGS) ×3 IMPLANT
ELECT REM PT RETURN 9FT ADLT (ELECTROSURGICAL) ×3
ELECTRODE REM PT RTRN 9FT ADLT (ELECTROSURGICAL) ×2 IMPLANT
EXTRACTOR VACUUM KIWI (MISCELLANEOUS) IMPLANT
GAUZE SPONGE 4X4 12PLY STRL (GAUZE/BANDAGES/DRESSINGS) ×3 IMPLANT
GLOVE BIOGEL PI IND STRL 7.5 (GLOVE) IMPLANT
GLOVE BIOGEL PI INDICATOR 7.5 (GLOVE) ×4
GLOVE PROTEXIS LATEX SZ 7.5 (GLOVE) ×12 IMPLANT
GLOVE SURG LATEX 7.5 PF (GLOVE) IMPLANT
GOWN STRL REUS W/ TWL LRG LVL3 (GOWN DISPOSABLE) ×6 IMPLANT
GOWN STRL REUS W/TWL LRG LVL3 (GOWN DISPOSABLE) ×3
NDL HYPO 25GX1X1/2 BEV (NEEDLE) ×2 IMPLANT
NEEDLE HYPO 25GX1X1/2 BEV (NEEDLE) ×3 IMPLANT
NS IRRIG 1000ML POUR BTL (IV SOLUTION) ×3 IMPLANT
PACK C SECTION AR (MISCELLANEOUS) ×3 IMPLANT
PAD ABD DERMACEA PRESS 5X9 (GAUZE/BANDAGES/DRESSINGS) ×2 IMPLANT
PAD DRESSING TELFA 3X8 NADH (GAUZE/BANDAGES/DRESSINGS) ×2 IMPLANT
PAD OB MATERNITY 4.3X12.25 (PERSONAL CARE ITEMS) ×4 IMPLANT
PAD PREP 24X41 OB/GYN DISP (PERSONAL CARE ITEMS) ×3 IMPLANT
PENCIL ELECTRO HAND CTR (MISCELLANEOUS) ×1 IMPLANT
PENCIL SMOKE ULTRAEVAC 22 CON (MISCELLANEOUS) ×2 IMPLANT
Reel    L103 Plaingut 2-0 ×1 IMPLANT
SPONGE LAP 18X18 RF (DISPOSABLE) ×2 IMPLANT
SUCT VACUUM KIWI BELL (SUCTIONS) IMPLANT
SUT 2-0 PL GUT LIGAPAK (SUTURE) ×1 IMPLANT
SUT MNCRL 4-0 (SUTURE) ×1
SUT MNCRL 4-0 27XMFL (SUTURE) ×2
SUT VIC AB 0 CT1 36 (SUTURE) ×8 IMPLANT
SUT VIC AB 0 CTX 36 (SUTURE) ×3
SUT VIC AB 0 CTX36XBRD ANBCTRL (SUTURE) ×4 IMPLANT
SUT VIC AB 2-0 SH 27 (SUTURE) ×2
SUT VIC AB 2-0 SH 27XBRD (SUTURE) ×4 IMPLANT
SUTURE MNCRL 4-0 27XMF (SUTURE) ×2 IMPLANT
SYR 30ML LL (SYRINGE) ×6 IMPLANT
TAPE CLOTH 3X10 WHT NS LF (GAUZE/BANDAGES/DRESSINGS) ×1 IMPLANT
TAPE MICROFOAM 4IN (TAPE) ×1 IMPLANT

## 2019-02-04 NOTE — Transfer of Care (Signed)
Immediate Anesthesia Transfer of Care Note  Patient: Angie Lamb  Procedure(s) Performed: CESAREAN SECTION WITH BILATERAL TUBAL LIGATION (N/A Abdomen)  Patient Location: Mother/Baby  Anesthesia Type:Spinal  Level of Consciousness: awake, alert  and oriented  Airway & Oxygen Therapy: Patient Spontanous Breathing  Post-op Assessment: Report given to RN and Post -op Vital signs reviewed and stable  Post vital signs: Reviewed  Last Vitals:  Vitals Value Taken Time  BP 106/71 02/04/19 2233  Temp 36.7 C 02/04/19 2233  Pulse 105 02/04/19 2233  Resp 16 02/04/19 2233  SpO2 99 % 02/04/19 2233    Last Pain:  Vitals:   02/04/19 1940  TempSrc:   PainSc: 9          Complications: No apparent anesthesia complications

## 2019-02-04 NOTE — Op Note (Signed)
Cesarean Section Procedure Note  Indications: abruptio placenta remote from delivery  Pre-operative Diagnosis:  1. Intrauterine pregnancy at [redacted]w[redacted]d;  2. Desires permanent sterilization;  3. PPROM 4. Covid positive 5. Fibroid uterus  Post-operative Diagnosis: same, delivered.  Procedure: 1. Primary Low Transverse Cesarean Section through Pfannenstiel incision  2. Bilateral tubal sterilization using modified Parkland method  Surgeon: Benjaman Kindler, MD  Assistant(s):  CST  Anesthesia: Spinal anesthesia  Estimated Blood Loss:  850            Total IV Fluids: 835ml  Urine Output: see anesthesia         Specimens: portion of right and left tubes         Complications:  None; patient tolerated the procedure well.         Disposition: PACU - hemodynamically stable.         Condition: stable  Findings:  A female infant in cephalic presentation. Amniotic fluid - Clear  Birth weight 6#4 oz.  Apgars of 8 and 9.   Intact placenta with a three-vessel cord.  Grossly normal uterus, tubes and ovaries bilaterally. No intraabdominal adhesions were noted.  Procedure Details  The patient was taken to Operating Room, identified as the correct patient and the procedure verified as C-Section Delivery. A Time Out was held and the above information confirmed.  After induction of anesthesia, the patient was draped and prepped in the usual sterile manner. A Pfannenstiel incision was made and carried down through the subcutaneous tissue to the fascia. Fascial incision was made and extended transversely with the Mayo scissors. The fascia was separated from the underlying rectus tissue superiorly and inferiorly. The peritoneum was identified and entered bluntly. Peritoneal incision was extended longitudinally. The utero-vesical peritoneal reflection was incised transversely and a bladder flap was created digitally.   A low transverse hysterotomy was made. The fetus was delivered atraumatically.  The umbilical cord was clamped x2 and cut and the infant was handed to the awaiting pediatricians. The placenta was removed intact and appeared normal with a 3-vessel cord.   The uterus was exteriorized and cleared of all clot and debris. The hysterotomy was closed with running sutures of  0-Vicryl. A second imbricating layer was placed with the same suture. Excellent hemostasis was observed.   Attention was then turned to the tubal ligation. The right fallopian tube distinguished from the round ligament by identifying the fimbria and was grasped with a Babcock clamp in the midisthmic portion approximately 3 cm from the cornual region. It was then doubly ligated with 0-plain gut suture in a Parkland fashion. The tubal segment was excised with Metzenbaum scissors. Tubal ostea noted. The procedure was repeated on the left side. *Care was noted to examine both tubal sites in situ to ensure the sutures were intact and no bleeding was noted. The uterus was returned to the abdomen. This was made difficult by the large intramural fibroids which were larger than the incision and required extension of the skin incision to replace it.  The pelvis was irrigated and again, excellent hemostasis was noted. The fascia was then reapproximated with running sutures of 0 Vicryl.  The subcutaneous tissue was reapproximated with interrupted sutures of 0-vicryl. The skin was reapproximated with a 4-0 Monocryl subcuticular stitch.  Exparel used in fascial and skin lines in standard fashion.  Instrument, sponge, and needle counts were correct prior to the abdominal closure and at the conclusion of the case.   The patient tolerated the procedure well and was  transferred to the recovery room in stable condition.   Benjaman Kindler, MD7/30/2020

## 2019-02-04 NOTE — Discharge Summary (Addendum)
Obstetrical Discharge Summary  Patient Name: Angie Lamb DOB: 04-10-1981 MRN: [id number]  Date of Admission: 02/04/2019 Date of Discharge: 02/06/2019 Primary OB: [facility]  Gestational Age at Delivery: [redacted]w[redacted]d   Antepartum complications: + covid   Anemia- seen by Hematology, has received iron infusions  DIabetes- dx at 14wks, GDM vs pre-gestational CHTN- procardia 30mg  XL daily Prepregnancy BMI 36.9, hx bariatric surgery Fibroids-  1) right fundal=8.5cm;  2) right lateral=8.7cm Advanced Maternal age   Admitting Diagnosis: PPROM with vaginal bleeding Secondary Diagnosis:Covid positive Patient Active Problem List   Diagnosis Date Noted  . Preterm uterine contractions 02/04/2019  . Preterm premature rupture of membranes, antepartum 02/04/2019  . Labor and delivery, indication for care 02/02/2019  . Third trimester pregnancy 12/22/2018  . Leukocytosis 12/22/2018  . Encounter for general adult medical examination with abnormal findings 04/05/2018  . Vitamin B12 deficiency 04/05/2018  . Vitamin D deficiency 04/05/2018  . Dermatitis 04/05/2018  . Needs flu shot 04/05/2018  . Acute cystitis with hematuria 01/04/2018  . Vaginal candidiasis 01/04/2018  . Dysuria 01/04/2018  . Mild obesity 10/06/2017  . Other fatigue 10/06/2017  . Iron deficiency anemia 01/05/2016  . Anemia 12/28/2015    Augmentation: Pitocin Complications: None Intrapartum complications/course:  C/s complicated by large fibroid with bleeding - abruption  Standard covid precautions  Date of Delivery: 02/04/19 Delivered By: Benjaman Kindler Delivery Type: primary cesarean section, low transverse incision Anesthesia: spinal Newborn Data: Live born female "Jerline Pain" Birth Weight: 6 lb 4.2 oz (2840 g) APGAR: 8, 9  Newborn Delivery   Birth date/time: 02/04/2019 21:00:00 Delivery type: C-Section, Low Transverse Trial of labor: Yes C-section categorization: Primary       Brief Hospital Course   (Cesarean Section): Angie Lamb is a Angie Lamb who underwent cesarean section on 02/04/2019.  Anemia and 1 unit blood transfusion POD#1 Patient had an uncomplicated surgery; for further details of this surgery, please refer to the operative note Anemia and 1 unit blood transfusion POD#1.pt was in neg pressure room for lntrapartum and PP course  By time of discharge on POD#2, her pain was controlled on oral pain medications; she had appropriate lochia and was ambulating, voiding without difficulty, tolerating regular diet and passing flatus. No SOB , no extreme fatigue    She was deemed stable for discharge to home.    Standard covid precautions  02/06/2019: mother developed a fever 101.6 . Baby had hyperbilirubinemia and we keep mother until 02/07/2019. Afebrile for 24 hrs and no uterine TTP . Incision C/D/I - d/c home   Discharge Physical Exam:  BP (!) 96/58 (BP Location: Right Arm)   Pulse (!) 109   Temp 99.1 F (37.3 C) (Oral)   Resp 18   Ht 5\' 1"  (1.549 m)   Wt 88.9 kg   LMP 06/01/2018 (Exact Date)   SpO2 100%   Breastfeeding Unknown   BMI 37.03 kg/m   General: NAD CV: RRR Pulm: CTABL, nl effort ABD: s/nd/nt, fundus firm and below the umbilicus Lochia: moderate Incision: c/d/i  DVT Evaluation: LE non-ttp, no evidence of DVT on exam.  Hemoglobin  Date Value Ref Range Status  02/06/2019 7.9 (L) 12.0 - 15.0 g/dL Final  02/06/2019 7.7 (L) 12.0 - 15.0 g/dL Final  04/03/2018 10.2 (L) 11.1 - 15.9 g/dL Final   HCT  Date Value Ref Range Status  02/06/2019 24.2 (L) 36.0 - 46.0 % Final    Comment:    Performed at [facility], [address] [place],  [place] [address]  02/06/2019 23.9 (L) 36.0 - 46.0 % Final   Hematocrit  Date Value Ref Range Status  04/03/2018 31.3 (L) 34.0 - 46.6 % Final   Results for orders placed or performed during the hospital encounter of 02/04/19 (from the past 24 hour(s))  CBC     Status: Abnormal   Collection Time: 02/05/19  7:26 PM  Result  Value Ref Range   WBC 18.2 (H) 4.0 - 10.5 K/uL   RBC 2.56 (L) 3.87 - 5.11 MIL/uL   Hemoglobin 7.6 (L) 12.0 - 15.0 g/dL   HCT 23.9 (L) 36.0 - 46.0 %   MCV 93.4 80.0 - 100.0 fL   MCH 29.7 26.0 - 34.0 pg   MCHC 31.8 30.0 - 36.0 g/dL   RDW 16.5 (H) 11.5 - 15.5 %   Platelets 179 150 - 400 K/uL   nRBC 0.0 0.0 - 0.2 %  Basic metabolic panel     Status: Abnormal   Collection Time: 02/05/19  7:26 PM  Result Value Ref Range   Sodium 135 135 - 145 mmol/L   Potassium 3.7 3.5 - 5.1 mmol/L   Chloride 108 98 - 111 mmol/L   CO2 19 (L) 22 - 32 mmol/L   Glucose, Bld 131 (H) 70 - 99 mg/dL   BUN 14 6 - 20 mg/dL   Creatinine, Ser 0.84 0.44 - 1.00 mg/dL   Calcium 8.7 (L) 8.9 - 10.3 mg/dL   GFR calc non Af Amer >60 >60 mL/min   GFR calc Af Amer >60 >60 mL/min   Anion gap 8 5 - 15  ABO/Rh     Status: None   Collection Time: 02/05/19  7:26 PM  Result Value Ref Range   ABO/RH(D)      A POS Performed at [facility], [address] [place], [place] [address]   Prepare RBC     Status: None   Collection Time: 02/05/19  9:19 PM  Result Value Ref Range   Order Confirmation      ORDER PROCESSED BY BLOOD BANK Performed at [facility], [address] [place], [place] [address]   Hemoglobin and hematocrit, blood     Status: Abnormal   Collection Time: 02/06/19  7:38 AM  Result Value Ref Range   Hemoglobin 7.9 (L) 12.0 - 15.0 g/dL   HCT 24.2 (L) 36.0 - 46.0 %  CBC     Status: Abnormal   Collection Time: 02/06/19  7:38 AM  Result Value Ref Range   WBC 19.0 (H) 4.0 - 10.5 K/uL   RBC 2.58 (L) 3.87 - 5.11 MIL/uL   Hemoglobin 7.7 (L) 12.0 - 15.0 g/dL   HCT 23.9 (L) 36.0 - 46.0 %   MCV 92.6 80.0 - 100.0 fL   MCH 29.8 26.0 - 34.0 pg   MCHC 32.2 30.0 - 36.0 g/dL   RDW 17.0 (H) 11.5 - 15.5 %   Platelets 173 150 - 400 K/uL   nRBC 0.0 0.0 - 0.2 %   Results for orders placed or performed during the hospital encounter of 02/04/19 (from the past 24 hour(s))  CBC     Status: Abnormal    Collection Time: 02/05/19  7:26 PM  Result Value Ref Range   WBC 18.2 (H) 4.0 - 10.5 K/uL   RBC 2.56 (L) 3.87 - 5.11 MIL/uL   Hemoglobin 7.6 (L) 12.0 - 15.0 g/dL   HCT 23.9 (L) 36.0 - 46.0 %   MCV 93.4 80.0 - 100.0 fL  MCH 29.7 26.0 - 34.0 pg   MCHC 31.8 30.0 - 36.0 g/dL   RDW 16.5 (H) 11.5 - 15.5 %   Platelets 179 150 - 400 K/uL   nRBC 0.0 0.0 - 0.2 %  Basic metabolic panel     Status: Abnormal   Collection Time: 02/05/19  7:26 PM  Result Value Ref Range   Sodium 135 135 - 145 mmol/L   Potassium 3.7 3.5 - 5.1 mmol/L   Chloride 108 98 - 111 mmol/L   CO2 19 (L) 22 - 32 mmol/L   Glucose, Bld 131 (H) 70 - 99 mg/dL   BUN 14 6 - 20 mg/dL   Creatinine, Ser 0.84 0.44 - 1.00 mg/dL   Calcium 8.7 (L) 8.9 - 10.3 mg/dL   GFR calc non Af Amer >60 >60 mL/min   GFR calc Af Amer >60 >60 mL/min   Anion gap 8 5 - 15  ABO/Rh     Status: None   Collection Time: 02/05/19  7:26 PM  Result Value Ref Range   ABO/RH(D)      A POS Performed at [facility], [address] [place], [place] [address]   Prepare RBC     Status: None   Collection Time: 02/05/19  9:19 PM  Result Value Ref Range   Order Confirmation      ORDER PROCESSED BY BLOOD BANK Performed at [facility], [address] [place], [place] [address]   Hemoglobin and hematocrit, blood     Status: Abnormal   Collection Time: 02/06/19  7:38 AM  Result Value Ref Range   Hemoglobin 7.9 (L) 12.0 - 15.0 g/dL   HCT 24.2 (L) 36.0 - 46.0 %  CBC     Status: Abnormal   Collection Time: 02/06/19  7:38 AM  Result Value Ref Range   WBC 19.0 (H) 4.0 - 10.5 K/uL   RBC 2.58 (L) 3.87 - 5.11 MIL/uL   Hemoglobin 7.7 (L) 12.0 - 15.0 g/dL   HCT 23.9 (L) 36.0 - 46.0 %   MCV 92.6 80.0 - 100.0 fL   MCH 29.8 26.0 - 34.0 pg   MCHC 32.2 30.0 - 36.0 g/dL   RDW 17.0 (H) 11.5 - 15.5 %   Platelets 173 150 - 400 K/uL   nRBC 0.0 0.0 - 0.2 %     Postpartum Procedures: BTL with c/s  Disposition: stable, discharge to home. Baby  Feeding: breastmilk Baby Disposition: home with mom  Rh Immune globulin given: n/a Rubella vaccine given: n/a Tdap vaccine given in AP or PP setting:  Contraception: BTL completed  Prenatal Labs: Blood type/Rh --/--/A POS Performed at [facility], [address] [place], [place] [address]  [phone])  Antibody screen neg  Rubella Immune  Varicella Immune  RPR NR  HBsAg Neg  HIV NR  GC neg  Chlamydia neg  Genetic screening negative  1 hour GTT 155  3 hour GTT 83, 200, 163, 54  GBS negative     Plan:  Angie Lamb was discharged to home in good condition. Follow-up appointment at [place] with delivering provider in 2 weeks   Discharge Medications: Allergies as of 02/06/2019      Reactions   Nsaids Other (See Comments)      Medication List    STOP taking these medications   b complex vitamins capsule   doxycycline 100 MG tablet Commonly known as: VIBRA-TABS   fluconazole 150 MG tablet Commonly known as: DIFLUCAN  Iron 325 (65 Fe) MG Tabs   Magnesium Ascorbate Powd   Multi-Vitamins Tabs   NIFEdipine 30 MG 24 hr tablet Commonly known as: PROCARDIA-XL/NIFEDICAL-XL   phentermine 37.5 MG tablet Commonly known as: ADIPEX-P   SM Calcium 500/Vitamin D3 500-400 MG-UNIT tablet Generic drug: calcium-vitamin D   triamcinolone cream 0.1 % Commonly known as: KENALOG   Vitamin D (Cholecalciferol) 25 MCG (1000 UT) Tabs     TAKE these medications   docusate sodium 100 MG capsule Commonly known as: Colace Take 1 capsule (100 mg total) by mouth daily as needed.   gabapentin 300 MG capsule Commonly known as: NEURONTIN Take 1 capsule (300 mg total) by mouth at bedtime for 10 days.   ondansetron 4 MG tablet Commonly known as: Zofran Take 1 tablet (4 mg total) by mouth every 8 (eight) hours as needed for nausea or vomiting.   oxyCODONE-acetaminophen 5-325 MG tablet Commonly known as: Percocet Take 1-2 tablets by mouth every 6  (six) hours as needed for severe pain.   PNV Prenatal Plus Multivitamin 27-1 MG Tabs Take by mouth.       Follow-up Information    Benjaman Kindler, MD In 2 weeks.   Specialty: Obstetrics and Gynecology Why: For postop check Contact information: [address] [place] [address] [phone]           Signed: Laverta Baltimore MD

## 2019-02-04 NOTE — OB Triage Note (Signed)
Pt presents to unit c/o of bright red vaginal bleeding that began this morning around 0130 when she got up to go to restroom. Pt reports filling up one washcloth prior to arriving to unit. Pt tested positive for covid-19 on 02/02/19. Pt reports positive fetal movement, denies LOF and ctx. Pt also denies anything in the vagina in the last 24 hours. Initial FHT 145. Vital signs WDL. Will continue to monitor.

## 2019-02-04 NOTE — H&P (Signed)
Angie Lamb is a 38 y.o. female. She is at [redacted]w[redacted]d gestation. Patient's last menstrual period was 06/01/2018 (exact date). Estimated Date of Delivery: 03/08/19  Prenatal care site: [facility]  Current pregnancy complicated by:  Anemia- seen by Hematology, has received iron infusions  DIabetes- dx at 14wks, GDM vs pre-gestational CHTN- procardia 30mg  XL daily Prepregnancy BMI 36.9, hx bariatric surgery Fibroids-  1) right fundal=8.5cm;  2) right lateral=8.7cm Advanced Maternal age  Chief complaint:  - preterm contractions - preterm premature rupture of membranes- AFI 6 with significant bleeding -covid positive   S: Resting comfortably. no CTX, no VB.no LOF,  Active fetal movement. Denies: HA, visual changes, SOB, or RUQ/epigastric pain; no palpitations, chest heaviness or increased edema.   Maternal Medical History:   Past Medical History:  Diagnosis Date  . Gestational diabetes   . Hypertension   . Reflux   . Sleep apnea, obstructive     Past Surgical History:  Procedure Laterality Date  . LAPAROSCOPIC GASTRIC SLEEVE RESECTION      Allergies  Allergen Reactions  . Nsaids Other (See Comments)    Prior to Admission medications   Medication Sig Start Date End Date Taking? Authorizing Provider  calcium-vitamin D (SM CALCIUM 500/VITAMIN D3) 500-400 MG-UNIT tablet Take by mouth.   Yes [provider]  Multiple Vitamin (MULTI-VITAMINS) TABS Take by mouth.   Yes [provider]  NIFEdipine (PROCARDIA-XL/NIFEDICAL-XL) 30 MG 24 hr tablet Take 30 mg by mouth daily. 11/26/18  Yes [provider]  Prenatal Vit-Fe Fumarate-FA (PNV PRENATAL PLUS MULTIVITAMIN) 27-1 MG TABS Take by mouth.   Yes [provider]  Vitamin D, Cholecalciferol, 25 MCG (1000 UT) TABS Take 1 tablet by mouth daily. 11/26/18  Yes [provider]  b complex vitamins capsule Take by mouth.    [provider]  doxycycline (VIBRA-TABS) 100 MG tablet  Take 1 tablet (100 mg total) by mouth 2 (two) times daily. Patient not taking: Reported on 12/22/2018 03/02/18   Kendell Bane, NP  Ferrous Sulfate (IRON) 325 (65 Fe) MG TABS Take by mouth.    [provider]  fluconazole (DIFLUCAN) 150 MG tablet Take 1 tablet po once. May repeat dose in 3 days as needed for persistent symptoms. Patient not taking: Reported on 12/22/2018 03/02/18   Kendell Bane, NP  Magnesium Ascorbate POWD     [provider]  phentermine (ADIPEX-P) 37.5 MG tablet Take 1 tablet (37.5 mg total) by mouth daily before breakfast. Patient not taking: Reported on 12/22/2018 01/22/18   Ronnell Freshwater, NP  triamcinolone cream (KENALOG) 0.1 % Apply 1 application topically 2 (two) times daily. Patient not taking: Reported on 12/22/2018 03/26/18   Ronnell Freshwater, NP      Social History: She  reports that she has never smoked. She has never used smokeless tobacco. She reports current alcohol use. She reports that she does not use drugs.  Family History: family history includes Diabetes in her paternal grandmother; Thyroid disease in her father and mother.   Review of Systems: A full review of systems was performed and negative except as noted in the HPI.     O:  BP (!) 101/57 (BP Location: Left Arm)   Pulse (!) 121   Temp 99.3 F (37.4 C) (Axillary)   Resp 17   Ht 5\' 1"  (1.549 m)   Wt 88.9 kg   LMP 06/01/2018 (Exact Date)   SpO2 98%   BMI 37.03 kg/m  Results  for orders placed or performed during the hospital encounter of 02/04/19 (from the past 48 hour(s))  Group B strep by PCR   Collection Time: 02/04/19  9:12 AM   Specimen: Vaginal/Rectal; Genital  Result Value Ref Range   Group B strep by PCR NEGATIVE NEGATIVE  Chlamydia/NGC rt PCR ([place] only)   Collection Time: 02/04/19  9:12 AM   Specimen: Vaginal/Rectal; Genital  Result Value Ref Range   Specimen source GC/Chlam ENDOCERVICAL    Chlamydia Tr NOT DETECTED NOT DETECTED   N gonorrhoeae NOT  DETECTED NOT DETECTED  Results for orders placed or performed during the hospital encounter of 02/02/19 (from the past 48 hour(s))  SARS Coronavirus 2 (CEPHEID - Performed in [place] hospital lab), [facility] Order   Collection Time: 02/02/19  8:03 PM   Specimen: Nasopharyngeal Swab  Result Value Ref Range   SARS Coronavirus 2 POSITIVE (A) NEGATIVE  Urinalysis, Routine w reflex microscopic   Collection Time: 02/02/19  8:03 PM  Result Value Ref Range   Color, Urine YELLOW (A) YELLOW   APPearance CLEAR (A) CLEAR   Specific Gravity, Urine 1.029 1.005 - 1.030   pH 6.0 5.0 - 8.0   Glucose, UA 50 (A) NEGATIVE mg/dL   Hgb urine dipstick NEGATIVE NEGATIVE   Bilirubin Urine NEGATIVE NEGATIVE   Ketones, ur 5 (A) NEGATIVE mg/dL   Protein, ur 30 (A) NEGATIVE mg/dL   Nitrite NEGATIVE NEGATIVE   Leukocytes,Ua NEGATIVE NEGATIVE   RBC / HPF 0-5 0 - 5 RBC/hpf   WBC, UA 0-5 0 - 5 WBC/hpf   Bacteria, UA NONE SEEN NONE SEEN   Squamous Epithelial / LPF 6-10 0 - 5   Mucus PRESENT    Ca Oxalate Crys, UA PRESENT   Urine Culture   Collection Time: 02/02/19  8:04 PM   Specimen: Urine, Clean Catch  Result Value Ref Range   Specimen Description      URINE, CLEAN CATCH Performed at [facility], [address], [place], [place] [address]    Special Requests      Normal Performed at [facility], [address] [place], [place] [address]    Culture (A)     <10,000 COLONIES/mL INSIGNIFICANT GROWTH Performed at [facility], [address] [place], [place] [address]    Report Status 02/03/2019 FINAL   Culture, blood (routine x 2)   Collection Time: 02/02/19  8:59 PM   Specimen: BLOOD  Result Value Ref Range   Specimen Description BLOOD RIGHT ASSIST CONTROL    Special Requests      BOTTLES DRAWN AEROBIC AND ANAEROBIC Blood Culture adequate volume   Culture      NO GROWTH 2 DAYS Performed at [facility], [address], [place], [place] [address]    Report  Status PENDING   Culture, blood (routine x 2)   Collection Time: 02/02/19  8:59 PM   Specimen: BLOOD  Result Value Ref Range   Specimen Description BLOOD RIGHT HAND    Special Requests      BOTTLES DRAWN AEROBIC AND ANAEROBIC Blood Culture adequate volume   Culture      NO GROWTH 2 DAYS Performed at [facility], [address] [place],  [address]    Report Status PENDING   CBC with Differential/Platelet   Collection Time: 02/02/19  8:59 PM  Result Value Ref Range   WBC 6.8 4.0 - 10.5 K/uL   RBC 3.03 (L) 3.87 - 5.11 MIL/uL  Hemoglobin 9.0 (L) 12.0 - 15.0 g/dL   HCT 28.2 (L) 36.0 - 46.0 %   MCV 93.1 80.0 - 100.0 fL   MCH 29.7 26.0 - 34.0 pg   MCHC 31.9 30.0 - 36.0 g/dL   RDW 16.4 (H) 11.5 - 15.5 %   Platelets 174 150 - 400 K/uL   nRBC 0.0 0.0 - 0.2 %   Neutrophils Relative % 84 %   Neutro Abs 5.6 1.7 - 7.7 K/uL   Lymphocytes Relative 11 %   Lymphs Abs 0.8 0.7 - 4.0 K/uL   Monocytes Relative 4 %   Monocytes Absolute 0.3 0.1 - 1.0 K/uL   Eosinophils Relative 0 %   Eosinophils Absolute 0.0 0.0 - 0.5 K/uL   Basophils Relative 0 %   Basophils Absolute 0.0 0.0 - 0.1 K/uL   Immature Granulocytes 1 %   Abs Immature Granulocytes 0.08 (H) 0.00 - 0.07 K/uL  Comprehensive metabolic panel   Collection Time: 02/02/19  8:59 PM  Result Value Ref Range   Sodium 134 (L) 135 - 145 mmol/L   Potassium 3.6 3.5 - 5.1 mmol/L   Chloride 107 98 - 111 mmol/L   CO2 20 (L) 22 - 32 mmol/L   Glucose, Bld 112 (H) 70 - 99 mg/dL   BUN 10 6 - 20 mg/dL   Creatinine, Ser 0.61 0.44 - 1.00 mg/dL   Calcium 8.1 (L) 8.9 - 10.3 mg/dL   Total Protein 6.0 (L) 6.5 - 8.1 g/dL   Albumin 2.5 (L) 3.5 - 5.0 g/dL   AST 18 15 - 41 U/L   ALT 6 0 - 44 U/L   Alkaline Phosphatase 69 38 - 126 U/L   Total Bilirubin 0.4 0.3 - 1.2 mg/dL   GFR calc non Af Amer >60 >60 mL/min   GFR calc Af Amer >60 >60 mL/min   Anion gap 7 5 - 15     Constitutional: NAD, AAOx3  HE/ENT: extraocular movements grossly  intact, moist mucous membranes CV: RRR PULM: nl respiratory effort, CTABL     Abd: gravid, non-tender, non-distended, soft      Ext: Non-tender, Nonedematous   Psych: mood appropriate, speech normal Pelvic: deferred   Fetal  monitoring: Cat I Appropriate for GA Baseline: 155bpm Variability: moderate Accelerations: present x >2 Decelerations absent Time 57mins   Prenatal Labs: Blood type/Rh A positive  Antibody screen neg  Rubella Immune  Varicella Immune  RPR NR  HBsAg Neg  HIV NR  GC neg  Chlamydia neg  Genetic screening negative  1 hour GTT 155  3 hour GTT N/a83, 200, 163, 54  GBS pending   SVE: 3/thick.high, soft, midposition  Principle Diagnosis:  High risk pregnancy in third trimester: GDMA1, CHTN, 35wks, Covid positive, PPROM and preterm labor Maternal fever Assessment:  Plan:   Covid precautions Labs pending Epidural when desired Continuous fetal monitoring   1. Fetal Well being  - Fetal Tracing: Cat I - Ultrasound: cephalic at bedside, anatomy reviewed, as above - Group B Streptococcus: pending. Will treat until results - Presentation: vtx confirmed by ultrasound   2. Routine OB: - Prenatal labs reviewed, as above - Rh positive  3. Post Partum Planning: - Infant feeding: breast - Contraception: interval BTL  4. Induction of Labor:  -  Contractions external toco in place -  Pelvis proven to 3232g -  Plan for induction with pitocin  Benjaman Kindler, MD 02/04/2019  12:22 PM

## 2019-02-04 NOTE — Progress Notes (Signed)
Called urgently to patient's bedside for heavy vaginal bleeding, with a quantitative EBL of 167ml during the vaginal exam. She is still 3cm with contractions q2-3 min and significant pain. She is still Cat I strip, but on my exam significant bright red bleeding remote from delivery. Her uterus is soft between contractions and her pain does decrease, so the likelihood of uterine rupture is low.   We discussed continuing induction for PPROM with continuous monitoring and pain control now. With joint decision making, she and I would both like to expedite delivery. She is positive for coronavirus and risk of aerosolization with intubation prompts me to proceed with spinal regional anesthesia.  The risks of cesarean section discussed with the patient included but were not limited to: bleeding which may require transfusion or reoperation; infection which may require antibiotics; injury to bowel, bladder, ureters or other surrounding organs; injury to the fetus; need for additional procedures including hysterectomy in the event of a life-threatening hemorrhage; placental abnormalities wth subsequent pregnancies, incisional problems, thromboembolic phenomenon and other postoperative/anesthesia complications. The patient concurred with the proposed plan, giving informed written consent for the procedure. Anesthesia and OR aware. Preoperative prophylactic antibiotics and SCDs ordered on call to the OR.  To OR when ready.

## 2019-02-04 NOTE — Anesthesia Preprocedure Evaluation (Signed)
Anesthesia Evaluation  Patient identified by MRN, date of birth, ID band Patient awake    Reviewed: Allergy & Precautions, H&P , NPO status , Patient's Chart, lab work & pertinent test results, reviewed documented beta blocker date and time   History of Anesthesia Complications Negative for: history of anesthetic complications  Airway Mallampati: III  TM Distance: >3 FB Neck ROM: full    Dental no notable dental hx.    Pulmonary neg shortness of breath, sleep apnea , neg COPD, Recent URI  (positive for COVID-19),    Pulmonary exam normal        Cardiovascular Exercise Tolerance: Good hypertension, On Medications (-) angina(-) Past MI Normal cardiovascular exam(-) dysrhythmias (-) Valvular Problems/Murmurs     Neuro/Psych negative neurological ROS  negative psych ROS   GI/Hepatic Neg liver ROS, GERD  ,  Endo/Other  diabetes, Gestational  Renal/GU negative Renal ROS  negative genitourinary   Musculoskeletal   Abdominal   Peds  Hematology  (+) Blood dyscrasia, anemia ,   Anesthesia Other Findings Past Medical History: No date: Gestational diabetes No date: Hypertension No date: Reflux No date: Sleep apnea, obstructive   Reproductive/Obstetrics (+) Pregnancy                             Anesthesia Physical Anesthesia Plan  ASA: III  Anesthesia Plan: Spinal   Post-op Pain Management:    Induction:   PONV Risk Score and Plan: 2 and Ondansetron and Dexamethasone  Airway Management Planned: Natural Airway and Nasal Cannula  Additional Equipment:   Intra-op Plan:   Post-operative Plan:   Informed Consent: I have reviewed the patients History and Physical, chart, labs and discussed the procedure including the risks, benefits and alternatives for the proposed anesthesia with the patient or authorized representative who has indicated his/her understanding and acceptance.      Dental Advisory Given  Plan Discussed with: Anesthesiologist, CRNA and Surgeon  Anesthesia Plan Comments:         Anesthesia Quick Evaluation

## 2019-02-04 NOTE — Anesthesia Post-op Follow-up Note (Signed)
Anesthesia QCDR form completed.        

## 2019-02-04 NOTE — Anesthesia Procedure Notes (Signed)
Spinal  Patient location during procedure: OR Start time: 02/04/2019 8:38 PM End time: 02/04/2019 8:39 PM Staffing Anesthesiologist: Martha Clan, MD Resident/CRNA: Rolla Plate, CRNA Performed: resident/CRNA  Preanesthetic Checklist Completed: patient identified, site marked, surgical consent, pre-op evaluation, timeout performed, IV checked, risks and benefits discussed and monitors and equipment checked Spinal Block Patient position: sitting Prep: ChloraPrep Patient monitoring: heart rate, continuous pulse ox, blood pressure and cardiac monitor Approach: midline Location: L3-4 Injection technique: single-shot Needle Needle type: [place] and Introducer  Needle gauge: 24 G Needle length: 9 cm Assessment Sensory level: T10 Additional Notes Negative paresthesia. Negative blood return. Positive free-flowing CSF. Expiration date of kit checked and confirmed. Patient tolerated procedure well, without complications.

## 2019-02-04 NOTE — Progress Notes (Signed)
Patient ID: Angie Lamb, female   DOB: 10/18/1980, 38 y.o.   MRN: [id number]  Angie Lamb is a 38 y.o. female. She is at [redacted]w[redacted]d gestation. Patient's last menstrual period was 06/01/2018 (exact date). Estimated Date of Delivery: 03/08/19 COVID POSITIVE Prenatal care site: [facility] Chief complaint: Pt with bleeding and ctx this am   No LOF  Maternal Medical History:   Past Medical History:  Diagnosis Date  . Gestational diabetes   . Hypertension   . Reflux   . Sleep apnea, obstructive   + covid test 02/02/2019  Past Surgical History:  Procedure Laterality Date  . LAPAROSCOPIC GASTRIC SLEEVE RESECTION      Allergies  Allergen Reactions  . Nsaids Other (See Comments)    Prior to Admission medications   Medication Sig Start Date End Date Taking? Authorizing Provider  NIFEdipine (PROCARDIA-XL/NIFEDICAL-XL) 30 MG 24 hr tablet Take 30 mg by mouth daily. 11/26/18  Yes [provider]  Prenatal Vit-Fe Fumarate-FA (PNV PRENATAL PLUS MULTIVITAMIN) 27-1 MG TABS Take by mouth.   Yes [provider]  Vitamin D, Cholecalciferol, 25 MCG (1000 UT) TABS Take 1 tablet by mouth daily. 11/26/18  Yes [provider]  b complex vitamins capsule Take by mouth.    [provider]  calcium-vitamin D (SM CALCIUM 500/VITAMIN D3) 500-400 MG-UNIT tablet Take by mouth.    [provider]  doxycycline (VIBRA-TABS) 100 MG tablet Take 1 tablet (100 mg total) by mouth 2 (two) times daily. Patient not taking: Reported on 12/22/2018 03/02/18   Kendell Bane, NP  Ferrous Sulfate (IRON) 325 (65 Fe) MG TABS Take by mouth.    [provider]  fluconazole (DIFLUCAN) 150 MG tablet Take 1 tablet po once. May repeat dose in 3 days as needed for persistent symptoms. Patient not taking: Reported on 12/22/2018 03/02/18   Kendell Bane, NP  Magnesium Ascorbate POWD     [provider]  Multiple Vitamin (MULTI-VITAMINS) TABS Take by mouth.     [provider]  phentermine (ADIPEX-P) 37.5 MG tablet Take 1 tablet (37.5 mg total) by mouth daily before breakfast. Patient not taking: Reported on 12/22/2018 01/22/18   Ronnell Freshwater, NP  triamcinolone cream (KENALOG) 0.1 % Apply 1 application topically 2 (two) times daily. Patient not taking: Reported on 12/22/2018 03/26/18   Ronnell Freshwater, NP     Social History: She  reports that she has never smoked. She has never used smokeless tobacco. She reports current alcohol use. She reports that she does not use drugs.  Family History: family history includes Diabetes in her paternal grandmother; Thyroid disease in her father and mother. no history of gyn cancers  Review of Systems: A full review of systems was performed and negative except as noted in the HPI.   Review of Systems: A full review of systems was performed and negative except as noted in the HPI.   Eyes: no vision change  Ears: left ear pain  Oropharynx: no sore throat  Pulmonary . No shortness of breath , no hemoptysis Cardiovascular: no chest pain , no irregular heart beat  Gastrointestinal:no blood in stool . No diarrhea, no constipation Uro gynecologic: no dysuria , no pelvic pain Neurologic : no seizure , no migraines    Musculoskeletal: no muscular weakness    O:  BP 116/74   Pulse 92   Temp 98.8 F (37.1 C) (Oral)   Resp 17   Ht 5\' 1"  (1.549 m)  Wt 88.9 kg   LMP 06/01/2018 (Exact Date)   SpO2 98%   BMI 37.03 kg/m  Results for orders placed or performed during the hospital encounter of 02/02/19 (from the past 48 hour(s))  SARS Coronavirus 2 (CEPHEID - Performed in [place] hospital lab), [facility] Order   Collection Time: 02/02/19  8:03 PM   Specimen: Nasopharyngeal Swab  Result Value Ref Range   SARS Coronavirus 2 POSITIVE (A) NEGATIVE  Urinalysis, Routine w reflex microscopic   Collection Time: 02/02/19  8:03 PM  Result Value Ref Range   Color, Urine YELLOW (A) YELLOW   APPearance CLEAR  (A) CLEAR   Specific Gravity, Urine 1.029 1.005 - 1.030   pH 6.0 5.0 - 8.0   Glucose, UA 50 (A) NEGATIVE mg/dL   Hgb urine dipstick NEGATIVE NEGATIVE   Bilirubin Urine NEGATIVE NEGATIVE   Ketones, ur 5 (A) NEGATIVE mg/dL   Protein, ur 30 (A) NEGATIVE mg/dL   Nitrite NEGATIVE NEGATIVE   Leukocytes,Ua NEGATIVE NEGATIVE   RBC / HPF 0-5 0 - 5 RBC/hpf   WBC, UA 0-5 0 - 5 WBC/hpf   Bacteria, UA NONE SEEN NONE SEEN   Squamous Epithelial / LPF 6-10 0 - 5   Mucus PRESENT    Ca Oxalate Crys, UA PRESENT   Urine Culture   Collection Time: 02/02/19  8:04 PM   Specimen: Urine, Clean Catch  Result Value Ref Range   Specimen Description      URINE, CLEAN CATCH Performed at [facility], [address], [place], [place] [address]    Special Requests      Normal Performed at [facility], [address], [place], [place] [address]    Culture (A)     <10,000 COLONIES/mL INSIGNIFICANT GROWTH Performed at [facility], [address] [place], [place] [address]    Report Status 02/03/2019 FINAL   Culture, blood (routine x 2)   Collection Time: 02/02/19  8:59 PM   Specimen: BLOOD  Result Value Ref Range   Specimen Description BLOOD RIGHT ASSIST CONTROL    Special Requests      BOTTLES DRAWN AEROBIC AND ANAEROBIC Blood Culture adequate volume   Culture      NO GROWTH < 12 HOURS Performed at [facility], [address] [place], [place] [address]    Report Status PENDING   Culture, blood (routine x 2)   Collection Time: 02/02/19  8:59 PM   Specimen: BLOOD  Result Value Ref Range   Specimen Description BLOOD RIGHT HAND    Special Requests      BOTTLES DRAWN AEROBIC AND ANAEROBIC Blood Culture adequate volume   Culture      NO GROWTH < 12 HOURS Performed at [facility], [address] [place], [place] [address]    Report Status PENDING   CBC with Differential/Platelet   Collection Time: 02/02/19  8:59 PM  Result Value Ref Range   WBC  6.8 4.0 - 10.5 K/uL   RBC 3.03 (L) 3.87 - 5.11 MIL/uL   Hemoglobin 9.0 (L) 12.0 - 15.0 g/dL   HCT 28.2 (L) 36.0 - 46.0 %   MCV 93.1 80.0 - 100.0 fL   MCH 29.7 26.0 - 34.0 pg   MCHC 31.9 30.0 - 36.0 g/dL   RDW 16.4 (H) 11.5 - 15.5 %   Platelets 174 150 - 400 K/uL   nRBC 0.0 0.0 - 0.2 %   Neutrophils Relative % 84 %   Neutro  Abs 5.6 1.7 - 7.7 K/uL   Lymphocytes Relative 11 %   Lymphs Abs 0.8 0.7 - 4.0 K/uL   Monocytes Relative 4 %   Monocytes Absolute 0.3 0.1 - 1.0 K/uL   Eosinophils Relative 0 %   Eosinophils Absolute 0.0 0.0 - 0.5 K/uL   Basophils Relative 0 %   Basophils Absolute 0.0 0.0 - 0.1 K/uL   Immature Granulocytes 1 %   Abs Immature Granulocytes 0.08 (H) 0.00 - 0.07 K/uL  Comprehensive metabolic panel   Collection Time: 02/02/19  8:59 PM  Result Value Ref Range   Sodium 134 (L) 135 - 145 mmol/L   Potassium 3.6 3.5 - 5.1 mmol/L   Chloride 107 98 - 111 mmol/L   CO2 20 (L) 22 - 32 mmol/L   Glucose, Bld 112 (H) 70 - 99 mg/dL   BUN 10 6 - 20 mg/dL   Creatinine, Ser 0.61 0.44 - 1.00 mg/dL   Calcium 8.1 (L) 8.9 - 10.3 mg/dL   Total Protein 6.0 (L) 6.5 - 8.1 g/dL   Albumin 2.5 (L) 3.5 - 5.0 g/dL   AST 18 15 - 41 U/L   ALT 6 0 - 44 U/L   Alkaline Phosphatase 69 38 - 126 U/L   Total Bilirubin 0.4 0.3 - 1.2 mg/dL   GFR calc non Af Amer >60 >60 mL/min   GFR calc Af Amer >60 >60 mL/min   Anion gap 7 5 - 15     Constitutional: NAD, AAOx3  HE/ENT: extraocular movements grossly intact, moist mucous membranes CV: RRR PULM: nl respiratory effort, CTABL     Abd: gravid, non-tender, non-distended, soft      Ext: Non-tender, Nonedmeatous   Psych: mood appropriate, speech normal Pelvic RN check- cx 2.5 cm  NST:  Baseline: 130 Variability: moderate Accelerations present x >2 Decelerations absent     Assessment: 38 y.o. [redacted]w[redacted]d here for antenatal surveillance during pregnancy. Uterine ctx q 3-5 min Reassuring fetal monitoring   Principle diagnosis: Preterm contraction  with early cervical change   Plan: Sq terbutaline x 1 given  Continue observation  For labor  Will hold Betamethasone given the unknown clinical significance of steroids and asymptomatic infection with Covid     T Schermerhorn MD Attending Obstetrician and Gynecologist [facility], Department of [facility]

## 2019-02-04 NOTE — Progress Notes (Signed)
Strip review:   Baseline: 150 Variability: moderate Accelerations present x >2 Decelerations absent Time 60mins  Pitocin at 38mu/min, mildly painful, palpating moderate Contratoins q3 min  Plan for epidural when desired. CBC with hgb higher after iv infusion

## 2019-02-05 ENCOUNTER — Encounter: Payer: Self-pay | Admitting: Obstetrics and Gynecology

## 2019-02-05 LAB — CBC
HCT: 26.4 % — ABNORMAL LOW (ref 36.0–46.0)
HCT: 23.9 % — ABNORMAL LOW (ref 36.0–46.0)
Hemoglobin: 8.3 g/dL — ABNORMAL LOW (ref 12.0–15.0)
Hemoglobin: 7.6 g/dL — ABNORMAL LOW (ref 12.0–15.0)
MCH: 29.6 pg (ref 26.0–34.0)
MCH: 29.7 pg (ref 26.0–34.0)
MCHC: 31.4 g/dL (ref 30.0–36.0)
MCHC: 31.8 g/dL (ref 30.0–36.0)
MCV: 94.3 fL (ref 80.0–100.0)
MCV: 93.4 fL (ref 80.0–100.0)
Platelets: 178 10*3/uL (ref 150–400)
Platelets: 179 10*3/uL (ref 150–400)
RBC: 2.8 MIL/uL — ABNORMAL LOW (ref 3.87–5.11)
RBC: 2.56 MIL/uL — ABNORMAL LOW (ref 3.87–5.11)
RDW: 16.3 % — ABNORMAL HIGH (ref 11.5–15.5)
RDW: 16.5 % — ABNORMAL HIGH (ref 11.5–15.5)
WBC: 16.4 10*3/uL — ABNORMAL HIGH (ref 4.0–10.5)
WBC: 18.2 10*3/uL — ABNORMAL HIGH (ref 4.0–10.5)
nRBC: 0 % (ref 0.0–0.2)
nRBC: 0 % (ref 0.0–0.2)

## 2019-02-05 LAB — PREPARE RBC (CROSSMATCH)

## 2019-02-05 LAB — BASIC METABOLIC PANEL
Anion gap: 8 (ref 5–15)
BUN: 14 mg/dL (ref 6–20)
CO2: 19 mmol/L — ABNORMAL LOW (ref 22–32)
Calcium: 8.7 mg/dL — ABNORMAL LOW (ref 8.9–10.3)
Chloride: 108 mmol/L (ref 98–111)
Creatinine, Ser: 0.84 mg/dL (ref 0.44–1.00)
GFR calc Af Amer: >60 mL/min (ref >60)
GFR calc non Af Amer: >60 mL/min (ref >60)
Glucose, Bld: 131 mg/dL — ABNORMAL HIGH (ref 70–99)
Potassium: 3.7 mmol/L (ref 3.5–5.1)
Sodium: 135 mmol/L (ref 135–145)

## 2019-02-05 LAB — ABO/RH: ABO/RH(D): A POS

## 2019-02-05 LAB — RPR: RPR Ser Ql: NONREACTIVE

## 2019-02-05 MED ORDER — DIPHENHYDRAMINE HCL 25 MG PO CAPS: 25.0000 mg | ORAL_CAPSULE | ORAL | Status: DC | PRN

## 2019-02-05 MED ORDER — PRENATAL MULTIVITAMIN CH
1.0000 | ORAL_TABLET | Freq: Every day | ORAL | Status: DC
  Administered 2019-02-05 – 2019-02-06 (×2): 1 via ORAL
  Filled 2019-02-05 (×2): qty 1

## 2019-02-05 MED ORDER — OXYTOCIN 40 UNITS IN NORMAL SALINE INFUSION - SIMPLE MED: 2.5000 [IU]/h | INTRAVENOUS | Status: DC

## 2019-02-05 MED ORDER — FUROSEMIDE 10 MG/ML IJ SOLN
20.0000 mg | Freq: Once | INTRAMUSCULAR | Status: AC
  Administered 2019-02-05: 20 mg via INTRAVENOUS
  Filled 2019-02-05: qty 2

## 2019-02-05 MED ORDER — SIMETHICONE 80 MG PO CHEW
80.0000 mg | CHEWABLE_TABLET | Freq: Three times a day (TID) | ORAL | Status: DC
  Administered 2019-02-05 – 2019-02-07 (×5): 80 mg via ORAL
  Filled 2019-02-05 (×4): qty 1

## 2019-02-05 MED ORDER — NALBUPHINE HCL 10 MG/ML IJ SOLN: 5.0000 mg | INTRAMUSCULAR | Status: DC | PRN

## 2019-02-05 MED ORDER — NALOXONE HCL 0.4 MG/ML IJ SOLN: 0.4000 mg | INTRAMUSCULAR | Status: DC | PRN

## 2019-02-05 MED ORDER — TETANUS-DIPHTH-ACELL PERTUSSIS 5-2.5-18.5 LF-MCG/0.5 IM SUSP
0.5000 mL | Freq: Once | INTRAMUSCULAR | Status: DC
  Filled 2019-02-05: qty 0.5

## 2019-02-05 MED ORDER — SIMETHICONE 80 MG PO CHEW: 80.0000 mg | CHEWABLE_TABLET | ORAL | Status: DC | PRN

## 2019-02-05 MED ORDER — SENNOSIDES-DOCUSATE SODIUM 8.6-50 MG PO TABS
2.0000 | ORAL_TABLET | ORAL | Status: DC
  Administered 2019-02-05 – 2019-02-07 (×3): 2 via ORAL
  Filled 2019-02-05 (×3): qty 2

## 2019-02-05 MED ORDER — MEPERIDINE HCL 25 MG/ML IJ SOLN: 6.2500 mg | INTRAMUSCULAR | Status: DC | PRN

## 2019-02-05 MED ORDER — ACETAMINOPHEN 500 MG PO TABS
1000.0000 mg | ORAL_TABLET | Freq: Four times a day (QID) | ORAL | Status: DC
  Administered 2019-02-05 – 2019-02-06 (×6): 1000 mg via ORAL
  Filled 2019-02-05 (×7): qty 2

## 2019-02-05 MED ORDER — LACTATED RINGERS IV SOLN
INTRAVENOUS | Status: DC
  Administered 2019-02-05: 21:00:00 via INTRAVENOUS

## 2019-02-05 MED ORDER — DIPHENHYDRAMINE HCL 50 MG/ML IJ SOLN: 12.5000 mg | INTRAMUSCULAR | Status: DC | PRN

## 2019-02-05 MED ORDER — KETOROLAC TROMETHAMINE 30 MG/ML IJ SOLN
30.0000 mg | Freq: Four times a day (QID) | INTRAMUSCULAR | Status: DC
  Administered 2019-02-05 – 2019-02-06 (×4): 30 mg via INTRAVENOUS
  Filled 2019-02-05 (×5): qty 1

## 2019-02-05 MED ORDER — SODIUM CHLORIDE 0.9% IV SOLUTION
Freq: Once | INTRAVENOUS | Status: AC
  Administered 2019-02-05: 22:00:00 via INTRAVENOUS

## 2019-02-05 MED ORDER — DIBUCAINE (PERIANAL) 1 % EX OINT: 1.0000 "application " | TOPICAL_OINTMENT | CUTANEOUS | Status: DC | PRN

## 2019-02-05 MED ORDER — DIPHENHYDRAMINE HCL 25 MG PO CAPS: 25.0000 mg | ORAL_CAPSULE | Freq: Four times a day (QID) | ORAL | Status: DC | PRN

## 2019-02-05 MED ORDER — BISACODYL 10 MG RE SUPP
10.0000 mg | Freq: Every day | RECTAL | Status: DC | PRN
  Filled 2019-02-05: qty 1

## 2019-02-05 MED ORDER — MEASLES, MUMPS & RUBELLA VAC IJ SOLR
0.5000 mL | Freq: Once | INTRAMUSCULAR | Status: DC
  Filled 2019-02-05: qty 0.5

## 2019-02-05 MED ORDER — LACTATED RINGERS IV BOLUS
500.0000 mL | Freq: Once | INTRAVENOUS | Status: AC
  Administered 2019-02-05: 500 mL via INTRAVENOUS

## 2019-02-05 MED ORDER — SODIUM CHLORIDE 0.9% FLUSH: 3.0000 mL | INTRAVENOUS | Status: DC | PRN

## 2019-02-05 MED ORDER — OXYCODONE HCL 5 MG PO TABS: 5.0000 mg | ORAL_TABLET | ORAL | Status: DC | PRN

## 2019-02-05 MED ORDER — NALBUPHINE HCL 10 MG/ML IJ SOLN: 5.0000 mg | Freq: Once | INTRAMUSCULAR | Status: DC | PRN

## 2019-02-05 MED ORDER — KETOROLAC TROMETHAMINE 30 MG/ML IJ SOLN: 30.0000 mg | Freq: Four times a day (QID) | INTRAMUSCULAR | Status: DC

## 2019-02-05 MED ORDER — NALOXONE HCL 4 MG/10ML IJ SOLN
1.0000 ug/kg/h | INTRAVENOUS | Status: DC | PRN
  Filled 2019-02-05: qty 5

## 2019-02-05 MED ORDER — SIMETHICONE 80 MG PO CHEW
80.0000 mg | CHEWABLE_TABLET | ORAL | Status: DC
  Filled 2019-02-05 (×2): qty 1

## 2019-02-05 MED ORDER — SIMETHICONE 80 MG PO CHEW: 80.0000 mg | CHEWABLE_TABLET | ORAL | Status: DC

## 2019-02-05 MED ORDER — OXYCODONE HCL 5 MG PO TABS
5.0000 mg | ORAL_TABLET | ORAL | Status: DC | PRN
  Administered 2019-02-06 – 2019-02-07 (×2): 5 mg via ORAL
  Filled 2019-02-05 (×3): qty 1

## 2019-02-05 MED ORDER — MENTHOL 3 MG MT LOZG
1.0000 | LOZENGE | OROMUCOSAL | Status: DC | PRN
  Filled 2019-02-05: qty 9

## 2019-02-05 MED ORDER — FERROUS SULFATE 325 (65 FE) MG PO TABS
325.0000 mg | ORAL_TABLET | Freq: Two times a day (BID) | ORAL | Status: DC
  Administered 2019-02-05 – 2019-02-07 (×3): 325 mg via ORAL
  Filled 2019-02-05 (×4): qty 1

## 2019-02-05 MED ORDER — COCONUT OIL OIL
1.0000 "application " | TOPICAL_OIL | Status: DC | PRN
  Filled 2019-02-05: qty 120

## 2019-02-05 MED ORDER — FLEET ENEMA 7-19 GM/118ML RE ENEM: 1.0000 | ENEMA | Freq: Every day | RECTAL | Status: DC | PRN

## 2019-02-05 MED ORDER — ONDANSETRON HCL 4 MG/2ML IJ SOLN: 4.0000 mg | Freq: Three times a day (TID) | INTRAMUSCULAR | Status: DC | PRN

## 2019-02-05 MED ORDER — WITCH HAZEL-GLYCERIN EX PADS: 1.0000 "application " | MEDICATED_PAD | CUTANEOUS | Status: DC | PRN

## 2019-02-05 MED ORDER — GABAPENTIN 300 MG PO CAPS
300.0000 mg | ORAL_CAPSULE | Freq: Every day | ORAL | Status: DC
  Administered 2019-02-05 – 2019-02-06 (×2): 300 mg via ORAL
  Filled 2019-02-05 (×3): qty 1

## 2019-02-05 NOTE — Progress Notes (Signed)
Low urine output this afternoon. 18cc/hr Hemoglobin dropping from this morning Giving 500cc bolus Will give 1 unit PRBC and see if improvement.  Patient otherwise asymptomatic; pain controlled, vitals stable.  BP 102/69 (BP Location: Right Arm)   Pulse (!) 112   Temp 98.5 F (36.9 C) (Oral)   Resp 20   Ht 5\' 1"  (1.549 m)   Wt 88.9 kg   LMP 06/01/2018 (Exact Date)   SpO2 98%   Breastfeeding Unknown   BMI 37.03 kg/m    Results for orders placed or performed during the hospital encounter of 02/04/19 (from the past 24 hour(s))  CBC     Status: Abnormal   Collection Time: 02/05/19  7:16 AM  Result Value Ref Range   WBC 16.4 (H) 4.0 - 10.5 K/uL   RBC 2.80 (L) 3.87 - 5.11 MIL/uL   Hemoglobin 8.3 (L) 12.0 - 15.0 g/dL   HCT 26.4 (L) 36.0 - 46.0 %   MCV 94.3 80.0 - 100.0 fL   MCH 29.6 26.0 - 34.0 pg   MCHC 31.4 30.0 - 36.0 g/dL   RDW 16.3 (H) 11.5 - 15.5 %   Platelets 178 150 - 400 K/uL   nRBC 0.0 0.0 - 0.2 %  CBC     Status: Abnormal   Collection Time: 02/05/19  7:26 PM  Result Value Ref Range   WBC 18.2 (H) 4.0 - 10.5 K/uL   RBC 2.56 (L) 3.87 - 5.11 MIL/uL   Hemoglobin 7.6 (L) 12.0 - 15.0 g/dL   HCT 23.9 (L) 36.0 - 46.0 %   MCV 93.4 80.0 - 100.0 fL   MCH 29.7 26.0 - 34.0 pg   MCHC 31.8 30.0 - 36.0 g/dL   RDW 16.5 (H) 11.5 - 15.5 %   Platelets 179 150 - 400 K/uL   nRBC 0.0 0.0 - 0.2 %  Basic metabolic panel     Status: Abnormal   Collection Time: 02/05/19  7:26 PM  Result Value Ref Range   Sodium 135 135 - 145 mmol/L   Potassium 3.7 3.5 - 5.1 mmol/L   Chloride 108 98 - 111 mmol/L   CO2 19 (L) 22 - 32 mmol/L   Glucose, Bld 131 (H) 70 - 99 mg/dL   BUN 14 6 - 20 mg/dL   Creatinine, Ser 0.84 0.44 - 1.00 mg/dL   Calcium 8.7 (L) 8.9 - 10.3 mg/dL   GFR calc non Af Amer >60 >60 mL/min   GFR calc Af Amer >60 >60 mL/min   Anion gap 8 5 - 15     COVID positive S/p cesarean for abruption this morning.  Continue to monitor closely.  ----- Larey Days, MD,  [place] Attending Obstetrician and Gynecologist [facility], Department of [facility]

## 2019-02-06 LAB — TYPE AND SCREEN
ABO/RH(D): A POS
Antibody Screen: NEGATIVE
Unit division: 0

## 2019-02-06 LAB — CBC WITH DIFFERENTIAL/PLATELET
Abs Immature Granulocytes: 0.46 10*3/uL — ABNORMAL HIGH (ref 0.00–0.07)
Basophils Absolute: 0 10*3/uL (ref 0.0–0.1)
Basophils Relative: 0 %
Eosinophils Absolute: 0.1 10*3/uL (ref 0.0–0.5)
Eosinophils Relative: 0 %
HCT: 23.1 % — ABNORMAL LOW (ref 36.0–46.0)
Hemoglobin: 7.5 g/dL — ABNORMAL LOW (ref 12.0–15.0)
Immature Granulocytes: 3 %
Lymphocytes Relative: 7 %
Lymphs Abs: 1.2 10*3/uL (ref 0.7–4.0)
MCH: 30.4 pg (ref 26.0–34.0)
MCHC: 32.5 g/dL (ref 30.0–36.0)
MCV: 93.5 fL (ref 80.0–100.0)
Monocytes Absolute: 0.3 10*3/uL (ref 0.1–1.0)
Monocytes Relative: 2 %
Neutro Abs: 14.5 10*3/uL — ABNORMAL HIGH (ref 1.7–7.7)
Neutrophils Relative %: 88 %
Platelets: 189 10*3/uL (ref 150–400)
RBC: 2.47 MIL/uL — ABNORMAL LOW (ref 3.87–5.11)
RDW: 16.9 % — ABNORMAL HIGH (ref 11.5–15.5)
WBC: 16.5 10*3/uL — ABNORMAL HIGH (ref 4.0–10.5)
nRBC: 0 % (ref 0.0–0.2)

## 2019-02-06 LAB — CBC
HCT: 23.9 % — ABNORMAL LOW (ref 36.0–46.0)
Hemoglobin: 7.7 g/dL — ABNORMAL LOW (ref 12.0–15.0)
MCH: 29.8 pg (ref 26.0–34.0)
MCHC: 32.2 g/dL (ref 30.0–36.0)
MCV: 92.6 fL (ref 80.0–100.0)
Platelets: 173 10*3/uL (ref 150–400)
RBC: 2.58 MIL/uL — ABNORMAL LOW (ref 3.87–5.11)
RDW: 17 % — ABNORMAL HIGH (ref 11.5–15.5)
WBC: 19 10*3/uL — ABNORMAL HIGH (ref 4.0–10.5)
nRBC: 0 % (ref 0.0–0.2)

## 2019-02-06 LAB — HEMOGLOBIN AND HEMATOCRIT, BLOOD
HCT: 24.2 % — ABNORMAL LOW (ref 36.0–46.0)
Hemoglobin: 7.9 g/dL — ABNORMAL LOW (ref 12.0–15.0)

## 2019-02-06 LAB — BPAM RBC
Blood Product Expiration Date: 202008132359
ISSUE DATE / TIME: 202007312234
Unit Type and Rh: 6200

## 2019-02-06 MED ORDER — GABAPENTIN 300 MG PO CAPS: 300.0000 mg | ORAL_CAPSULE | Freq: Every day | ORAL | 0 refills | Status: DC

## 2019-02-06 MED ORDER — BENZOCAINE-MENTHOL 20-0.5 % EX AERO
1.0000 "application " | INHALATION_SPRAY | Freq: Four times a day (QID) | CUTANEOUS | Status: DC | PRN
  Filled 2019-02-06: qty 56

## 2019-02-06 MED ORDER — DOCUSATE SODIUM 100 MG PO CAPS: 100.0000 mg | ORAL_CAPSULE | Freq: Every day | ORAL | 2 refills | Status: DC | PRN

## 2019-02-06 MED ORDER — ACETAMINOPHEN 500 MG PO TABS
1000.0000 mg | ORAL_TABLET | Freq: Four times a day (QID) | ORAL | Status: DC
  Administered 2019-02-07 (×2): 1000 mg via ORAL
  Filled 2019-02-06 (×2): qty 2

## 2019-02-06 MED ORDER — OXYCODONE-ACETAMINOPHEN 5-325 MG PO TABS: 1.0000 | ORAL_TABLET | Freq: Four times a day (QID) | ORAL | 0 refills | Status: DC | PRN

## 2019-02-06 MED ORDER — ONDANSETRON HCL 4 MG PO TABS: 4.0000 mg | ORAL_TABLET | Freq: Three times a day (TID) | ORAL | 0 refills | Status: DC | PRN

## 2019-02-06 MED ORDER — IBUPROFEN 800 MG PO TABS
800.0000 mg | ORAL_TABLET | Freq: Three times a day (TID) | ORAL | Status: DC | PRN
  Administered 2019-02-06 – 2019-02-07 (×3): 800 mg via ORAL
  Filled 2019-02-06 (×3): qty 1

## 2019-02-06 NOTE — Anesthesia Postprocedure Evaluation (Signed)
Anesthesia Post Note  Patient: Angie Lamb  Procedure(s) Performed: CESAREAN SECTION WITH BILATERAL TUBAL LIGATION (N/A Abdomen)  Patient location during evaluation: Mother Baby Anesthesia Type: Spinal Level of consciousness: oriented and awake and alert Pain management: pain level controlled Respiratory status: spontaneous breathing and respiratory function stable Cardiovascular status: blood pressure returned to baseline and stable Postop Assessment: no headache, no backache, no apparent nausea or vomiting, able to ambulate and spinal receding Anesthetic complications: no     Last Vitals:  Vitals:   02/05/19 2311 02/06/19 0107  BP: (!) 96/49 (!) 96/58  Pulse: (!) 106 (!) 109  Resp: 20 18  Temp: 37.1 C 37.3 C  SpO2: 100%     Last Pain:  Vitals:   02/06/19 0115  TempSrc:   PainSc: [place]

## 2019-02-06 NOTE — Lactation Note (Signed)
This note was copied from a baby's chart. Lactation Consultation Note  Patient Name: Angie Lamb [id number] Date: 02/06/2019 Reason for consult: Follow-up assessment;Late-preterm 34-36.6wks;Other (Comment)(+ Covid-19) BAby tires easily at breast. Initiate pumping after feeds for supplement, especially if wt decreases more tonight  Maternal Data Formula Feeding for Exclusion: No Does the patient have breastfeeding experience prior to this delivery?: Yes She pumped and bottlefed her last child, 45 yr old    Feeding  Baby feeds for short periods, tires quickly, sleepy baby,last feeding was 25 min on one breast, best feeding since yesterday per mom, I did not observe this feeding  LATCH Score Latch: (did not observe a feeding)                 Interventions Interventions: DEBP, pump breasts after breastfeeding baby, give EBM to baby after breastfeeding, if small amt may supplement with formula Gerber, approx 15 cc until taking more from breast.    Lactation Tools Discussed/Used WIC Program: Yes Pump Review: Setup, frequency, and cleaning;Milk Storage Initiated by:: Chaya Jan RNC IBCLC Date initiated:: 02/06/19   Consult Status Consult Status: PRN If continues to need to pump after discharge, may contact [place] [place] for pump loan   Ferol Luz 02/06/2019, 6:55 PM

## 2019-02-06 NOTE — Progress Notes (Signed)
Notified by RN that foley was not draining, thus UOP was drastically low. Due to overfilling of balloon.  Once this was discovered, balloon was slightly deflated and urine out was significant, and adequate.  Foley was removed.   Patient receiving 1u PRBC without complication. Will recheck H/H with 5am labs.  ----- Larey Days, MD, [place] Attending Obstetrician and Gynecologist [facility], Department of [facility]

## 2019-02-06 NOTE — Progress Notes (Signed)
Vital signs checked, elevated temp and tachycardia. Pt complains of being very hot, current issue with HVAC in room, awaiting transfer to another negative pressure room. Scheduled Tylenol given, applied cold washcloths to face/neck and 2 fans placed in room, cold fluids and ice chips/popsicles given. Pt is very agitated due to room temperature, issues with birth certificate (dad not present, unable to add). Will notify MD.

## 2019-02-06 NOTE — Progress Notes (Signed)
MD placed order for ibuprofen 800mg . Verified with patient that she is able to take ibuprofen with history of gastric sleeve. Pt stated she can take it if not for extended period of time. Notified pharmacist who verified.

## 2019-02-06 NOTE — Progress Notes (Signed)
MD notified of elevated temp and tachycardia. Orders placed for CBC and U/A, plan to place orders for blood cultures x 2 if temp over 100.4 this evening.

## 2019-02-07 LAB — URINALYSIS, ROUTINE W REFLEX MICROSCOPIC
Bacteria, UA: NONE SEEN
Bilirubin Urine: NEGATIVE
Glucose, UA: NEGATIVE mg/dL
Ketones, ur: NEGATIVE mg/dL
Leukocytes,Ua: NEGATIVE
Nitrite: NEGATIVE
Protein, ur: NEGATIVE mg/dL
RBC / HPF: >50 RBC/hpf — ABNORMAL HIGH (ref 0–5)
Specific Gravity, Urine: 1.021 (ref 1.005–1.030)
pH: 5 (ref 5.0–8.0)

## 2019-02-07 LAB — CULTURE, BLOOD (ROUTINE X 2)
Culture: NO GROWTH
Culture: NO GROWTH
Special Requests: ADEQUATE
Special Requests: ADEQUATE

## 2019-02-07 MED ORDER — OXYCODONE-ACETAMINOPHEN 5-325 MG PO TABS: 1.0000 | ORAL_TABLET | ORAL | 0 refills | Status: DC | PRN

## 2019-02-07 MED ORDER — IBUPROFEN 800 MG PO TABS: 800.0000 mg | ORAL_TABLET | Freq: Three times a day (TID) | ORAL | 0 refills | Status: DC | PRN

## 2019-02-07 NOTE — Progress Notes (Signed)
Pt called the Mother-Baby unit shortly after discharge, yelling and upset, states when she arrived to [place] they were closed. Pt stated, "somebody better do something". Advised patient to page [facility] and speak with answering service to contact provider.

## 2019-02-07 NOTE — Progress Notes (Signed)
Reviewed D/C instructions with pt and sister in law. Pt verbalized understanding of teaching. Discharged to home via W/C. Pt to schedule f/u appt.

## 2019-02-07 NOTE — Progress Notes (Addendum)
  To Whom It May Concern:   Angie Lamb was seen and treated in our Emergency Department, Labor and Delivery and [facility] from 7/30-02/07/2019.  During her hospitalization, she tested positive for SARS-COVID-19.  [name] was present during her entire hospitalization as a support person, and was exposed during this time. We recommend that she Caryl Pina) contact her PCP for testing protocol and self monitor at home for symptoms until released by her PCP.   Sincerely,   [facility]  [place], [place] [address] [phone]  [facility] [address] [place], [place] [address] [phone]

## 2019-02-07 NOTE — Discharge Instructions (Signed)
Cesarean Delivery, Care After This sheet gives you information about how to care for yourself after your procedure. Your health care provider may also give you more specific instructions. If you have problems or questions, contact your health care provider. What can I expect after the procedure? After the procedure, it is common to have:  A small amount of blood or clear fluid coming from the incision.  Some redness, swelling, and pain in your incision area.  Some abdominal pain and soreness.  Vaginal bleeding (lochia). Even though you did not have a vaginal delivery, you will still have vaginal bleeding and discharge.  Pelvic cramps.  Fatigue. You may have pain, swelling, and discomfort in the tissue between your vagina and your anus (perineum) if:  Your C-section was unplanned, and you were allowed to labor and push.  An incision was made in the area (episiotomy) or the tissue tore during attempted vaginal delivery. Follow these instructions at home: Incision care   Follow instructions from your health care provider about how to take care of your incision. Make sure you: ? Wash your hands with soap and water before you change your bandage (dressing). If soap and water are not available, use hand sanitizer. ? If you have a dressing, change it or remove it as told by your health care provider. ? Leave stitches (sutures), skin staples, skin glue, or adhesive strips in place. These skin closures may need to stay in place for 2 weeks or longer. If adhesive strip edges start to loosen and curl up, you may trim the loose edges. Do not remove adhesive strips completely unless your health care provider tells you to do that.  Check your incision area every day for signs of infection. Check for: ? More redness, swelling, or pain. ? More fluid or blood. ? Warmth. ? Pus or a bad smell.  Do not take baths, swim, or use a hot tub until your health care provider says it's okay. Ask your health  care provider if you can take showers.  When you cough or sneeze, hug a pillow. This helps with pain and decreases the chance of your incision opening up (dehiscing). Do this until your incision heals. Medicines  Take over-the-counter and prescription medicines only as told by your health care provider.  Take an over the counter iron supplement along with Vitamin C (for low hemoglobin levels).  If you were prescribed an antibiotic medicine, take it as told by your health care provider. Do not stop taking the antibiotic even if you start to feel better.  Do not drive or use heavy machinery while taking prescription pain medicine. Lifestyle  Do not drink alcohol. This is especially important if you are breastfeeding or taking pain medicine.  Do not use any products that contain nicotine or tobacco, such as cigarettes, e-cigarettes, and chewing tobacco. If you need help quitting, ask your health care provider. Eating and drinking  Drink at least 8 eight-ounce glasses of water every day unless told not to by your health care provider. If you breastfeed, you may need to drink even more water.  Eat high-fiber foods every day. These foods may help prevent or relieve constipation. High-fiber foods include: ? Whole grain cereals and breads. ? Brown rice. ? Beans. ? Fresh fruits and vegetables. Activity   If possible, have someone help you care for your baby and help with household activities for at least a few days after you leave the hospital.  Return to your normal activities as  told by your health care provider. Ask your health care provider what activities are safe for you.  Rest as much as possible. Try to rest or take a nap while your baby is sleeping.  Do not lift anything that is heavier than 10 lbs (4.5 kg), or the limit that you were told, until your health care provider says that it is safe.  Talk with your health care provider about when you can engage in sexual activity. This  may depend on your: ? Risk of infection. ? How fast you heal. ? Comfort and desire to engage in sexual activity. General instructions  Do not use tampons or douches until your health care provider approves.  Wear loose, comfortable clothing and a supportive and well-fitting bra.  Keep your perineum clean and dry. Wipe from front to back when you use the toilet.  If you pass a blood clot, save it and call your health care provider to discuss. Do not flush blood clots down the toilet before you get instructions from your health care provider.  Keep all follow-up visits for you and your baby as told by your health care provider. This is important. Contact a health care provider if:  You have: ? A fever. ? Bad-smelling vaginal discharge. ? Pus or a bad smell coming from your incision. ? Difficulty or pain when urinating. ? A sudden increase or decrease in the frequency of your bowel movements. ? More redness, swelling, or pain around your incision. ? More fluid or blood coming from your incision. ? A rash. ? Nausea. ? Little or no interest in activities you used to enjoy. ? Questions about caring for yourself or your baby.  Your incision feels warm to the touch.  Your breasts turn red or become painful or hard.  You feel unusually sad or worried.  You vomit.  You pass a blood clot from your vagina.  You urinate more than usual.  You are dizzy or light-headed. Get help right away if:  You have: ? Pain that does not go away or get better with medicine. ? Chest pain. ? Difficulty breathing. ? Blurred vision or spots in your vision. ? Thoughts about hurting yourself or your baby. ? New pain in your abdomen or in one of your legs. ? A severe headache.  You faint.  You bleed from your vagina so much that you fill more than one sanitary pad in one hour. Bleeding should not be heavier than your heaviest period. Summary  After the procedure, it is common to have pain at  your incision site, abdominal cramping, and slight bleeding from your vagina.  Check your incision area every day for signs of infection.  Tell your health care provider about any unusual symptoms.  Keep all follow-up visits for you and your baby as told by your health care provider. This information is not intended to replace advice given to you by your health care provider. Make sure you discuss any questions you have with your health care provider. Document Released: 03/16/2002 Document Revised: 12/31/2017 Document Reviewed: 12/31/2017 Elsevier Patient Education  2020 [employer].

## 2019-02-07 NOTE — Progress Notes (Signed)
Pt asked about iron supplementation. Per Dr. Ouida Sills, pt should take OTC iron supplement along with Vitamin C. Also instructed pt to take EITHER percocet OR tylenol, not both.  Pt verbalized understanding.

## 2019-02-07 NOTE — Progress Notes (Signed)
Pt tearful, upset and anxious about issues with dining services and birth certificate. Pt also complains of scratchy throat, states she feels like she has a fever. Vital signs checked, temp 100.3. BP 109/78 Pulse 96. MD paged regarding fever. Per Dr. Ouida Sills, fever is likely related to Covid, as labs and assessment do not indicate signs of infection. Recommend proceed with discharge as planned, advised pt to follow up with PCP after discharge regarding covid symptoms. Reviewed with patient signs and symptoms to monitor for, and when to call the doctor or signs of emergency. Patient verbalized understanding, agrees with discharge plan, will follow up with her MD.

## 2019-02-08 LAB — SURGICAL PATHOLOGY

## 2019-08-27 ENCOUNTER — Telehealth: Payer: Self-pay

## 2019-08-27 NOTE — Telephone Encounter (Signed)
Confirmed appointment on 08/31/2019 and screened for covid. klh 

## 2019-08-31 ENCOUNTER — Ambulatory Visit: Payer: Medicaid Other | Admitting: Nurse Practitioner

## 2019-08-31 ENCOUNTER — Other Ambulatory Visit: Payer: Self-pay

## 2019-08-31 VITALS — BP 126/74 | HR 87 | Temp 97.3°F | Resp 16 | Ht 60.0 in | Wt 177.0 lb

## 2019-08-31 DIAGNOSIS — Z6834 Body mass index (BMI) 34.0-34.9, adult: Secondary | ICD-10-CM | POA: Diagnosis not present

## 2019-08-31 DIAGNOSIS — J300 Vasomotor rhinitis: Secondary | ICD-10-CM

## 2019-08-31 DIAGNOSIS — F411 Generalized anxiety disorder: Secondary | ICD-10-CM | POA: Diagnosis not present

## 2019-08-31 MED ORDER — FLUTICASONE PROPIONATE 50 MCG/ACT NA SUSP: 2.0000 | Freq: Every day | NASAL | 6 refills | Status: DC

## 2019-08-31 MED ORDER — PHENTERMINE HCL 37.5 MG PO TABS: 37.5000 mg | ORAL_TABLET | Freq: Every day | ORAL | 0 refills | Status: DC

## 2019-08-31 NOTE — Progress Notes (Signed)
[facility] [address] [place], [place] [address]  Internal MEDICINE  Office Visit Note  Patient Name: Angie Lamb  [id number]  [id number]  Date of Service: 09/05/2019  Chief Complaint  Patient presents with  . Follow-up    discuss weight loss  . Referral    therapist  . Nasal Congestion    congested for months     The patient is here for routine visit. She had baby in July, 2020. She has been working from home. She has three other kids at home. They are aged 43, 29, and 65 years old. She is concerned about difficulty losing weight. Needs to have jump start to help her with weight loss. Just prior to getting pregnant, she was 167 pounds. She is currently 177 pounds. She has an ultimate goal of 140 pounds. She has been on phentermine in the past and has done well.  Also would like too be referred to counselor. With all of the stress going on, she feels like it would be beneficial to talk with someone about issues causing her anxiety.       Current Medication: Outpatient Encounter Medications as of 08/31/2019  Medication Sig  . fluticasone (FLONASE) 50 MCG/ACT nasal spray Place 2 sprays into both nostrils daily.  Marland Kitchen gabapentin (NEURONTIN) 300 MG capsule Take 1 capsule (300 mg total) by mouth at bedtime for 10 days.  . [DISCONTINUED] docusate sodium (COLACE) 100 MG capsule Take 1 capsule (100 mg total) by mouth daily as needed. (Patient not taking: Reported on 08/31/2019)  . [DISCONTINUED] ibuprofen (ADVIL) 800 MG tablet Take 1 tablet (800 mg total) by mouth 3 (three) times daily as needed for fever or moderate pain. (Patient not taking: Reported on 08/31/2019)  . [DISCONTINUED] ondansetron (ZOFRAN) 4 MG tablet Take 1 tablet (4 mg total) by mouth every 8 (eight) hours as needed for nausea or vomiting. (Patient not taking: Reported on 08/31/2019)  . [DISCONTINUED] oxyCODONE-acetaminophen (PERCOCET) 5-325 MG tablet Take 1-2 tablets by mouth every 6 (six) hours as needed for  severe pain. (Patient not taking: Reported on 08/31/2019)  . [DISCONTINUED] oxyCODONE-acetaminophen (PERCOCET) 5-325 MG tablet Take 1 tablet by mouth every 4 (four) hours as needed for severe pain. (Patient not taking: Reported on 08/31/2019)  . [DISCONTINUED] oxyCODONE-acetaminophen (PERCOCET) 5-325 MG tablet Take 1 tablet by mouth every 4 (four) hours as needed for severe pain. (Patient not taking: Reported on 08/31/2019)  . [DISCONTINUED] phentermine (ADIPEX-P) 37.5 MG tablet Take 1 tablet (37.5 mg total) by mouth daily before breakfast.  . [DISCONTINUED] Prenatal Vit-Fe Fumarate-FA (PNV PRENATAL PLUS MULTIVITAMIN) 27-1 MG TABS Take by mouth.   No facility-administered encounter medications on file as of 08/31/2019.    Surgical History: Past Surgical History:  Procedure Laterality Date  . CESAREAN SECTION WITH BILATERAL TUBAL LIGATION N/A 02/04/2019   Procedure: CESAREAN SECTION WITH BILATERAL TUBAL LIGATION;  Surgeon: Benjaman Kindler, MD;  Location: [facility] ORS;  Service: Obstetrics;  Laterality: N/A;  . LAPAROSCOPIC GASTRIC SLEEVE RESECTION      Medical History: Past Medical History:  Diagnosis Date  . Gestational diabetes   . Hypertension   . Reflux   . Sleep apnea, obstructive     Family History: Family History  Problem Relation Age of Onset  . Diabetes Paternal Grandmother   . Thyroid disease Mother   . Thyroid disease Father     Social History   Socioeconomic History  . Marital status: Single    Spouse name: Not on file  .  Number of children: Not on file  . Years of education: Not on file  . Highest education level: Not on file  Occupational History  . Not on file  Tobacco Use  . Smoking status: Never Smoker  . Smokeless tobacco: Never Used  Substance and Sexual Activity  . Alcohol use: Yes    Comment: Occasionally every few months   . Drug use: No  . Sexual activity: Yes    Birth control/protection: Surgical  Other Topics Concern  . Not on file  Social  History Narrative  . Not on file   Social Determinants of Health   Financial Resource Strain:   . Difficulty of Paying Living Expenses: Not on file  Food Insecurity:   . Worried About Charity fundraiser in the Last Year: Not on file  . Ran Out of Food in the Last Year: Not on file  Transportation Needs:   . Lack of Transportation (Medical): Not on file  . Lack of Transportation (Non-Medical): Not on file  Physical Activity:   . Days of Exercise per Week: Not on file  . Minutes of Exercise per Session: Not on file  Stress:   . Feeling of Stress : Not on file  Social Connections:   . Frequency of Communication with Friends and Family: Not on file  . Frequency of Social Gatherings with Friends and Family: Not on file  . Attends Religious Services: Not on file  . Active Member of Clubs or Organizations: Not on file  . Attends Archivist Meetings: Not on file  . Marital Status: Not on file  Intimate Partner Violence: Unknown  . Fear of Current or Ex-Partner: Not asked  . Emotionally Abused: Patient refused  . Physically Abused: Not asked  . Sexually Abused: Not asked      Review of Systems  Constitutional: Positive for unexpected weight change. Negative for chills and fatigue.       Weight gain and difficulty with weight loss since having baby in July   HENT: Positive for congestion. Negative for postnasal drip, rhinorrhea, sneezing and sore throat.   Respiratory: Negative for cough, chest tightness, shortness of breath and wheezing.   Cardiovascular: Negative for chest pain and palpitations.  Gastrointestinal: Negative for abdominal pain, constipation, diarrhea, nausea and vomiting.  Endocrine: Negative for cold intolerance, heat intolerance, polydipsia and polyuria.  Musculoskeletal: Negative for arthralgias, back pain, joint swelling and neck pain.  Skin: Negative for rash.  Allergic/Immunologic: Negative for environmental allergies.  Neurological: Negative for  dizziness, tremors, numbness and headaches.  Hematological: Negative for adenopathy. Does not bruise/bleed easily.  Psychiatric/Behavioral: Negative for behavioral problems (Depression), sleep disturbance and suicidal ideas. The patient is nervous/anxious.     Today's Vitals   08/31/19 1525  BP: 126/74  Pulse: 87  Resp: 16  Temp: (!) 97.3 F (36.3 C)  SpO2: 100%  Weight: 177 lb (80.3 kg)  Height: 5' (1.524 m)   Body mass index is 34.57 kg/m.  Physical Exam Vitals and nursing note reviewed.  Constitutional:      General: She is not in acute distress.    Appearance: Normal appearance. She is well-developed. She is not diaphoretic.  HENT:     Head: Normocephalic and atraumatic.     Nose: Nose normal.     Mouth/Throat:     Pharynx: No oropharyngeal exudate.  Eyes:     Pupils: Pupils are equal, round, and reactive to light.  Neck:     Thyroid: No thyromegaly.  Vascular: No JVD.     Trachea: No tracheal deviation.  Cardiovascular:     Rate and Rhythm: Normal rate and regular rhythm.     Heart sounds: Normal heart sounds. No murmur. No friction rub. No gallop.   Pulmonary:     Effort: Pulmonary effort is normal. No respiratory distress.     Breath sounds: Normal breath sounds. No wheezing or rales.  Chest:     Chest wall: No tenderness.  Abdominal:     Palpations: Abdomen is soft.  Musculoskeletal:        General: Normal range of motion.     Cervical back: Normal range of motion and neck supple.  Lymphadenopathy:     Cervical: No cervical adenopathy.  Skin:    General: Skin is warm and dry.  Neurological:     Mental Status: She is alert and oriented to person, place, and time.     Cranial Nerves: No cranial nerve deficit.  Psychiatric:        Mood and Affect: Mood normal.        Behavior: Behavior normal.        Thought Content: Thought content normal.        Judgment: Judgment normal.    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 Use  two sprays in both nostrils daily.  - fluticasone (FLONASE) 50 MCG/ACT nasal spray; Place 2 sprays into both nostrils daily.  Dispense: 16 g; Refill: 6  2. BMI 34.0-34.9,adult Start phentermine 37.5mg  daily. Limit calorie intake to 1200-1500 calories per day. Incorporate exercise into daily routine.   3. Generalized anxiety disorder Refer to counseling services for further evaluation and treatment.  - Ambulatory referral to Psychology  General Counseling: Roselie verbalizes understanding of the findings of todays visit and agrees with plan of treatment. I have discussed any further diagnostic evaluation that may be needed or ordered today. We also reviewed her medications today. she has been encouraged to call the office with any questions or concerns that should arise related to todays visit.   There is a liability release in patients' chart. There has been a 10 minute discussion about the side effects including but not limited to elevated blood pressure, anxiety, lack of sleep and dry mouth. Pt understands and will like to start/continue on appetite suppressant at this time. There will be one month RX given at the time of visit with proper follow up. [place] diet plan with restricted calories is given to the pt. Pt understands and agrees with  plan of treatment  This patient was seen by Leretha Pol FNP Collaboration with Dr Lavera Guise as a part of collaborative care agreement  Orders Placed This Encounter  Procedures  . Ambulatory referral to Psychology    Meds ordered this encounter  Medications  . DISCONTD: phentermine (ADIPEX-P) 37.5 MG tablet    Sig: Take 1 tablet (37.5 mg total) by mouth daily before breakfast.    Dispense:  30 tablet    Refill:  0    Order Specific Question:   Supervising Provider    Answer:   Lavera Guise [place]  . fluticasone (FLONASE) 50 MCG/ACT nasal spray    Sig: Place 2 sprays into both nostrils daily.    Dispense:  16 g    Refill:  6    Order  Specific Question:   Supervising Provider    Answer:   Lavera Guise [id number]    Total time spent: 30 Minutes  Time spent  includes review of chart, medications, test results, and follow up plan with the patient.      Dr Lavera Guise Internal medicine

## 2019-09-01 ENCOUNTER — Other Ambulatory Visit: Payer: Self-pay | Admitting: Nurse Practitioner

## 2019-09-01 ENCOUNTER — Encounter: Payer: Self-pay | Admitting: Nurse Practitioner

## 2019-09-01 DIAGNOSIS — Z6834 Body mass index (BMI) 34.0-34.9, adult: Secondary | ICD-10-CM

## 2019-09-01 MED ORDER — PHENTERMINE HCL 37.5 MG PO TABS: 37.5000 mg | ORAL_TABLET | Freq: Every day | ORAL | 0 refills | Status: DC

## 2019-09-01 NOTE — Progress Notes (Signed)
Sent new prescripton for phentermine to [place] as this is less expensive.

## 2019-09-05 ENCOUNTER — Encounter: Payer: Self-pay | Admitting: Nurse Practitioner

## 2019-09-05 DIAGNOSIS — Z6834 Body mass index (BMI) 34.0-34.9, adult: Secondary | ICD-10-CM | POA: Insufficient documentation

## 2019-09-05 DIAGNOSIS — F411 Generalized anxiety disorder: Secondary | ICD-10-CM | POA: Insufficient documentation

## 2019-09-05 DIAGNOSIS — J300 Vasomotor rhinitis: Secondary | ICD-10-CM | POA: Insufficient documentation

## 2019-09-24 ENCOUNTER — Telehealth: Payer: Self-pay

## 2019-09-24 NOTE — Telephone Encounter (Signed)
LMOM TO CONFIRM AND SCREEN FOR 09-28-19 OV.

## 2019-09-28 ENCOUNTER — Other Ambulatory Visit: Payer: Self-pay

## 2019-09-28 ENCOUNTER — Encounter: Payer: Self-pay | Admitting: Nurse Practitioner

## 2019-09-28 ENCOUNTER — Ambulatory Visit: Payer: Medicaid Other | Admitting: Nurse Practitioner

## 2019-09-28 VITALS — BP 121/81 | HR 90 | Temp 97.3°F | Resp 16 | Ht 60.0 in | Wt 169.6 lb

## 2019-09-28 DIAGNOSIS — R5383 Other fatigue: Secondary | ICD-10-CM | POA: Diagnosis not present

## 2019-09-28 DIAGNOSIS — Z6834 Body mass index (BMI) 34.0-34.9, adult: Secondary | ICD-10-CM

## 2019-09-28 MED ORDER — PHENTERMINE HCL 37.5 MG PO TABS: 37.5000 mg | ORAL_TABLET | Freq: Every day | ORAL | 0 refills | Status: DC

## 2019-09-28 NOTE — Progress Notes (Signed)
[facility] [place], [place] [address]  Internal MEDICINE  Office Visit Note  Patient Name: Angie Lamb  [id number]  [id number]  Date of Service: 10/10/2019  Chief Complaint  Patient presents with  . Follow-up    weight management     The patient is here for follow up of weight management. She was recently started on phentermine. She has lost 8 pounds since her last visit. She states that she is doing well. She denies negative side effects associated with taking appetite suppressant. She is limiting her calorie intake and is gradually increasing her level of routine exercise.       Current Medication: Outpatient Encounter Medications as of 09/28/2019  Medication Sig  . fluticasone (FLONASE) 50 MCG/ACT nasal spray Place 2 sprays into both nostrils daily.  . phentermine (ADIPEX-P) 37.5 MG tablet Take 1 tablet (37.5 mg total) by mouth daily before breakfast.  . [DISCONTINUED] phentermine (ADIPEX-P) 37.5 MG tablet Take 1 tablet (37.5 mg total) by mouth daily before breakfast.  . gabapentin (NEURONTIN) 300 MG capsule Take 1 capsule (300 mg total) by mouth at bedtime for 10 days.   No facility-administered encounter medications on file as of 09/28/2019.    Surgical History: Past Surgical History:  Procedure Laterality Date  . CESAREAN SECTION WITH BILATERAL TUBAL LIGATION N/A 02/04/2019   Procedure: CESAREAN SECTION WITH BILATERAL TUBAL LIGATION;  Surgeon: Benjaman Kindler, MD;  Location: [facility] ORS;  Service: Obstetrics;  Laterality: N/A;  . LAPAROSCOPIC GASTRIC SLEEVE RESECTION      Medical History: Past Medical History:  Diagnosis Date  . Gestational diabetes   . Hypertension   . Reflux   . Sleep apnea, obstructive     Family History: Family History  Problem Relation Age of Onset  . Diabetes Paternal Grandmother   . Thyroid disease Mother   . Thyroid disease Father     Social History   Socioeconomic History  . Marital status: Single   Spouse name: Not on file  . Number of children: Not on file  . Years of education: Not on file  . Highest education level: Not on file  Occupational History  . Not on file  Tobacco Use  . Smoking status: Never Smoker  . Smokeless tobacco: Never Used  Substance and Sexual Activity  . Alcohol use: Yes    Comment: Occasionally every few months   . Drug use: No  . Sexual activity: Yes    Birth control/protection: Surgical  Other Topics Concern  . Not on file  Social History Narrative  . Not on file   Social Determinants of Health   Financial Resource Strain:   . Difficulty of Paying Living Expenses:   Food Insecurity:   . Worried About Charity fundraiser in the Last Year:   . Arboriculturist in the Last Year:   Transportation Needs:   . Film/video editor (Medical):   Marland Kitchen Lack of Transportation (Non-Medical):   Physical Activity:   . Days of Exercise per Week:   . Minutes of Exercise per Session:   Stress:   . Feeling of Stress :   Social Connections:   . Frequency of Communication with Friends and Family:   . Frequency of Social Gatherings with Friends and Family:   . Attends Religious Services:   . Active Member of Clubs or Organizations:   . Attends Archivist Meetings:   Marland Kitchen Marital Status:   Intimate Partner Violence: Unknown  .  Fear of Current or Ex-Partner: Not asked  . Emotionally Abused: Patient refused  . Physically Abused: Not asked  . Sexually Abused: Not asked      Review of Systems  Constitutional: Negative for chills, fatigue and unexpected weight change.       Eight pound weight loss since her last visit .  HENT: Negative for congestion, postnasal drip, rhinorrhea, sneezing and sore throat.   Respiratory: Negative for cough, chest tightness, shortness of breath and wheezing.   Cardiovascular: Negative for chest pain and palpitations.  Gastrointestinal: Negative for abdominal pain, constipation, diarrhea, nausea and vomiting.  Endocrine:  Negative for cold intolerance, heat intolerance, polydipsia and polyuria.  Musculoskeletal: Negative for arthralgias, back pain, joint swelling and neck pain.  Skin: Negative for rash.  Allergic/Immunologic: Negative for environmental allergies.  Neurological: Negative for dizziness, tremors, numbness and headaches.  Hematological: Negative for adenopathy. Does not bruise/bleed easily.  Psychiatric/Behavioral: Negative for behavioral problems (Depression), sleep disturbance and suicidal ideas. The patient is nervous/anxious.     Today's Vitals   09/28/19 1551  BP: 121/81  Pulse: 90  Resp: 16  Temp: (!) 97.3 F (36.3 C)  SpO2: 100%  Weight: 169 lb 9.6 oz (76.9 kg)  Height: 5' (1.524 m)   Body mass index is 33.12 kg/m.  Physical Exam Vitals and nursing note reviewed.  Constitutional:      General: She is not in acute distress.    Appearance: Normal appearance. She is well-developed. She is not diaphoretic.  HENT:     Head: Normocephalic and atraumatic.     Nose: Nose normal.     Mouth/Throat:     Pharynx: No oropharyngeal exudate.  Eyes:     Pupils: Pupils are equal, round, and reactive to light.  Neck:     Thyroid: No thyromegaly.     Vascular: No JVD.     Trachea: No tracheal deviation.  Cardiovascular:     Rate and Rhythm: Normal rate and regular rhythm.     Heart sounds: Normal heart sounds. No murmur. No friction rub. No gallop.   Pulmonary:     Effort: Pulmonary effort is normal. No respiratory distress.     Breath sounds: Normal breath sounds. No wheezing or rales.  Chest:     Chest wall: No tenderness.  Abdominal:     Palpations: Abdomen is soft.  Musculoskeletal:        General: Normal range of motion.     Cervical back: Normal range of motion and neck supple.  Lymphadenopathy:     Cervical: No cervical adenopathy.  Skin:    General: Skin is warm and dry.  Neurological:     Mental Status: She is alert and oriented to person, place, and time.      Cranial Nerves: No cranial nerve deficit.  Psychiatric:        Mood and Affect: Mood normal.        Behavior: Behavior normal.        Thought Content: Thought content normal.        Judgment: Judgment normal.    Assessment/Plan: 1. Other fatigue Improving. Will continue to monitor.   2. BMI 34.0-34.9,adult Improving. Continue phentermine daily. Limit calorie intake to 1200-1500 calories per day. Incorporate exercise into daily routine.  - phentermine (ADIPEX-P) 37.5 MG tablet; Take 1 tablet (37.5 mg total) by mouth daily before breakfast.  Dispense: 30 tablet; Refill: 0  This patient was seen by Leretha Pol FNP Collaboration with Dr Lavera Guise  as a part of collaborative care agreement  General Counseling: Sabrea verbalizes understanding of the findings of todays visit and agrees with plan of treatment. I have discussed any further diagnostic evaluation that may be needed or ordered today. We also reviewed her medications today. she has been encouraged to call the office with any questions or concerns that should arise related to todays visit.    There is a liability release in patients' chart. There has been a 10 minute discussion about the side effects including but not limited to elevated blood pressure, anxiety, lack of sleep and dry mouth. Pt understands and will like to start/continue on appetite suppressant at this time. There will be one month RX given at the time of visit with proper follow up. Nova diet plan with restricted calories is given to the pt. Pt understands and agrees with  plan of treatment  Meds ordered this encounter  Medications  . phentermine (ADIPEX-P) 37.5 MG tablet    Sig: Take 1 tablet (37.5 mg total) by mouth daily before breakfast.    Dispense:  30 tablet    Refill:  0    Order Specific Question:   Supervising Provider    Answer:   Lavera Guise [id number]    Total time spent: 20 Minutes   Time spent includes review of chart, medications, test  results, and follow up plan with the patient.      Dr Lavera Guise Internal medicine

## 2019-10-06 ENCOUNTER — Encounter: Payer: Self-pay | Admitting: Nurse Practitioner

## 2019-10-06 ENCOUNTER — Other Ambulatory Visit: Payer: Self-pay | Admitting: Nurse Practitioner

## 2019-10-06 DIAGNOSIS — H60503 Unspecified acute noninfective otitis externa, bilateral: Secondary | ICD-10-CM

## 2019-10-06 MED ORDER — CIPROFLOXACIN-DEXAMETHASONE 0.3-0.1 % OT SUSP: 4.0000 [drp] | Freq: Two times a day (BID) | OTIC | 0 refills | Status: DC

## 2019-10-06 NOTE — Progress Notes (Signed)
Sent prescription for ciprodex ear drops. Insert four drops into both ears twice daily for next 7 days. Sent to [address].

## 2019-10-28 ENCOUNTER — Ambulatory Visit: Payer: Medicaid Other | Admitting: Nurse Practitioner

## 2020-02-10 ENCOUNTER — Encounter: Payer: Self-pay | Admitting: Nurse Practitioner

## 2020-02-11 ENCOUNTER — Ambulatory Visit: Payer: Medicaid Other | Admitting: Adult Health

## 2020-02-11 ENCOUNTER — Other Ambulatory Visit: Payer: Self-pay

## 2020-02-11 ENCOUNTER — Telehealth: Payer: Self-pay

## 2020-02-11 ENCOUNTER — Other Ambulatory Visit: Payer: Self-pay | Admitting: Adult Health

## 2020-02-11 ENCOUNTER — Encounter: Payer: Self-pay | Admitting: Adult Health

## 2020-02-11 VITALS — BP 135/52 | HR 83 | Temp 97.4°F | Resp 16 | Ht 60.0 in | Wt 167.4 lb

## 2020-02-11 DIAGNOSIS — N3001 Acute cystitis with hematuria: Secondary | ICD-10-CM

## 2020-02-11 DIAGNOSIS — Z6834 Body mass index (BMI) 34.0-34.9, adult: Secondary | ICD-10-CM

## 2020-02-11 DIAGNOSIS — R300 Dysuria: Secondary | ICD-10-CM | POA: Diagnosis not present

## 2020-02-11 LAB — POCT URINALYSIS DIPSTICK
Bilirubin, UA: NEGATIVE
Glucose, UA: NEGATIVE
Ketones, UA: 5
Nitrite, UA: NEGATIVE
Protein, UA: POSITIVE — AB
Spec Grav, UA: 1.01 (ref 1.010–1.025)
Urobilinogen, UA: 0.2 E.U./dL
pH, UA: 5 (ref 5.0–8.0)

## 2020-02-11 MED ORDER — PHENTERMINE HCL 37.5 MG PO TABS: 37.5000 mg | ORAL_TABLET | Freq: Every day | ORAL | 0 refills | Status: DC

## 2020-02-11 MED ORDER — NITROFURANTOIN MONOHYD MACRO 100 MG PO CAPS: 100.0000 mg | ORAL_CAPSULE | Freq: Two times a day (BID) | ORAL | 0 refills | Status: AC

## 2020-02-11 NOTE — Progress Notes (Signed)
Sent New Rx for phentermine

## 2020-02-11 NOTE — Telephone Encounter (Signed)
Confirmed and screened for 02-11-20 ov.

## 2020-02-11 NOTE — Progress Notes (Signed)
[facility] [place], [place] [address]  Internal MEDICINE  Office Visit Note  Patient Name: Angie Lamb  [id number]  [id number]  Date of Service: 02/11/2020  Chief Complaint  Patient presents with  . Acute Visit    frequency and pressure when urniating     HPI Pt is here for a sick visit.  Pt reports a couple days of urinary frequency.  She is having some dysuria.  She denies any odor or other complaints.  She has had UTi in the past but it has been quite some time.    Current Medication:  Outpatient Encounter Medications as of 02/11/2020  Medication Sig  . phentermine (ADIPEX-P) 37.5 MG tablet Take 1 tablet (37.5 mg total) by mouth daily before breakfast.  . [DISCONTINUED] ciprofloxacin-dexamethasone (CIPRODEX) OTIC suspension Place 4 drops into both ears 2 (two) times daily.  . [DISCONTINUED] fluticasone (FLONASE) 50 MCG/ACT nasal spray Place 2 sprays into both nostrils daily.  . [DISCONTINUED] gabapentin (NEURONTIN) 300 MG capsule Take 1 capsule (300 mg total) by mouth at bedtime for 10 days.   No facility-administered encounter medications on file as of 02/11/2020.      Medical History: Past Medical History:  Diagnosis Date  . Gestational diabetes   . Hypertension   . Reflux   . Sleep apnea, obstructive      Vital Signs: BP (!) 135/52   Pulse 83   Temp (!) 97.4 F (36.3 C)   Resp 16   Ht 5' (1.524 m)   Wt 167 lb 6.4 oz (75.9 kg)   SpO2 100%   BMI 32.69 kg/m    Review of Systems  Constitutional: Negative for chills, fatigue and unexpected weight change.  HENT: Negative for congestion, rhinorrhea, sneezing and sore throat.   Eyes: Negative for photophobia, pain and redness.  Respiratory: Negative for cough, chest tightness and shortness of breath.   Cardiovascular: Negative for chest pain and palpitations.  Gastrointestinal: Negative for abdominal pain, constipation, diarrhea, nausea and vomiting.  Endocrine: Negative.    Genitourinary: Positive for dysuria and frequency. Negative for flank pain and hematuria.  Musculoskeletal: Negative for arthralgias, back pain, joint swelling and neck pain.  Skin: Negative for rash.  Allergic/Immunologic: Negative.   Neurological: Negative for tremors and numbness.  Hematological: Negative for adenopathy. Does not bruise/bleed easily.  Psychiatric/Behavioral: Negative for behavioral problems and sleep disturbance. The patient is not nervous/anxious.     Physical Exam Vitals and nursing note reviewed.  Constitutional:      General: She is not in acute distress.    Appearance: She is well-developed. She is not diaphoretic.  HENT:     Head: Normocephalic and atraumatic.     Mouth/Throat:     Pharynx: No oropharyngeal exudate.  Eyes:     Pupils: Pupils are equal, round, and reactive to light.  Neck:     Thyroid: No thyromegaly.     Vascular: No JVD.     Trachea: No tracheal deviation.  Cardiovascular:     Rate and Rhythm: Normal rate and regular rhythm.     Heart sounds: Normal heart sounds. No murmur heard.  No friction rub. No gallop.   Pulmonary:     Effort: Pulmonary effort is normal. No respiratory distress.     Breath sounds: Normal breath sounds. No wheezing or rales.  Chest:     Chest wall: No tenderness.  Abdominal:     Palpations: Abdomen is soft.     Tenderness: There is  no abdominal tenderness. There is no guarding.  Musculoskeletal:        General: Normal range of motion.     Cervical back: Normal range of motion and neck supple.  Lymphadenopathy:     Cervical: No cervical adenopathy.  Skin:    General: Skin is warm and dry.  Neurological:     Mental Status: She is alert and oriented to person, place, and time.     Cranial Nerves: No cranial nerve deficit.  Psychiatric:        Behavior: Behavior normal.        Thought Content: Thought content normal.        Judgment: Judgment normal.    Assessment/Plan: 1. Acute cystitis with hematuria  Advised patient to take entire course of antibiotics as prescribed with food. Pt should return to clinic in 7-10 days if symptoms fail to improve or new symptoms develop.  - nitrofurantoin, macrocrystal-monohydrate, (MACROBID) 100 MG capsule; Take 1 capsule (100 mg total) by mouth 2 (two) times daily for 7 days.  Dispense: 14 capsule; Refill: 0  2. Dysuria - POCT Urinalysis Dipstick  General Counseling: Alpha verbalizes understanding of the findings of todays visit and agrees with plan of treatment. I have discussed any further diagnostic evaluation that may be needed or ordered today. We also reviewed her medications today. she has been encouraged to call the office with any questions or concerns that should arise related to todays visit.   Orders Placed This Encounter  Procedures  . POCT Urinalysis Dipstick    No orders of the defined types were placed in this encounter.   Time spent: 25  Minutes  This patient was seen by Kendell Bane  AGNP-C in Collaboration with Dr Lavera Guise as a part of collaborative care agreement.  Kendell Bane AGNP-C Internal Medicine

## 2020-02-13 ENCOUNTER — Encounter: Payer: Self-pay | Admitting: Nurse Practitioner

## 2020-02-14 ENCOUNTER — Other Ambulatory Visit: Payer: Self-pay

## 2020-02-14 MED ORDER — FLUCONAZOLE 150 MG PO TABS: ORAL_TABLET | ORAL | 0 refills | Status: DC

## 2020-02-14 NOTE — Telephone Encounter (Signed)
Spoke to adam and he sent in diflucan 100 mg to [facility]

## 2020-02-17 LAB — CULTURE, URINE COMPREHENSIVE

## 2020-03-09 ENCOUNTER — Telehealth: Payer: Self-pay

## 2020-03-09 NOTE — Telephone Encounter (Signed)
Lmom to confirm and screen for 03-14-20 ov.

## 2020-03-14 ENCOUNTER — Ambulatory Visit: Payer: Medicaid Other | Admitting: Nurse Practitioner

## 2020-03-14 ENCOUNTER — Other Ambulatory Visit: Payer: Self-pay

## 2020-03-14 VITALS — BP 133/74 | HR 91 | Temp 98.7°F | Resp 16 | Ht 61.0 in | Wt 163.0 lb

## 2020-03-14 DIAGNOSIS — R5383 Other fatigue: Secondary | ICD-10-CM | POA: Diagnosis not present

## 2020-03-14 DIAGNOSIS — Z6830 Body mass index (BMI) 30.0-30.9, adult: Secondary | ICD-10-CM

## 2020-03-14 MED ORDER — PHENTERMINE HCL 37.5 MG PO TABS: 37.5000 mg | ORAL_TABLET | Freq: Every day | ORAL | 0 refills | Status: DC

## 2020-03-14 NOTE — Progress Notes (Signed)
[facility] [place], [place] [address]  Internal MEDICINE  Office Visit Note  Patient Name: Angie Lamb  [id number]  [id number]  Date of Service: 03/29/2020  Chief Complaint  Patient presents with  . Follow-up    weight loss  . Hypertension  . Quality Metric Gaps    HepC, flu shot    The patient is here for follow up of weight management. She is currently on phentermine to help her lose weight. She has decreased her calorie intake to 1200 calories per day. She is exercising three to four times weekly. She has lost four pounds since her last visit. She states that she is doing well. She reports no negative side effects associated with taking phentermine.       Current Medication: Outpatient Encounter Medications as of 03/14/2020  Medication Sig  . fluconazole (DIFLUCAN) 150 MG tablet Take 1 tablet every three days as needed  . phentermine (ADIPEX-P) 37.5 MG tablet Take 1 tablet (37.5 mg total) by mouth daily before breakfast.  . [DISCONTINUED] phentermine (ADIPEX-P) 37.5 MG tablet Take 1 tablet (37.5 mg total) by mouth daily before breakfast.   No facility-administered encounter medications on file as of 03/14/2020.    Surgical History: Past Surgical History:  Procedure Laterality Date  . CESAREAN SECTION WITH BILATERAL TUBAL LIGATION N/A 02/04/2019   Procedure: CESAREAN SECTION WITH BILATERAL TUBAL LIGATION;  Surgeon: Benjaman Kindler, MD;  Location: [facility] ORS;  Service: Obstetrics;  Laterality: N/A;  . LAPAROSCOPIC GASTRIC SLEEVE RESECTION      Medical History: Past Medical History:  Diagnosis Date  . Gestational diabetes   . Hypertension   . Reflux   . Sleep apnea, obstructive     Family History: Family History  Problem Relation Age of Onset  . Diabetes Paternal Grandmother   . Thyroid disease Mother   . Thyroid disease Father     Social History   Socioeconomic History  . Marital status: Single    Spouse name: Not on file  .  Number of children: Not on file  . Years of education: Not on file  . Highest education level: Not on file  Occupational History  . Not on file  Tobacco Use  . Smoking status: Never Smoker  . Smokeless tobacco: Never Used  Vaping Use  . Vaping Use: Never used  Substance and Sexual Activity  . Alcohol use: Yes    Comment: Occasionally every few months   . Drug use: No  . Sexual activity: Yes    Birth control/protection: Surgical  Other Topics Concern  . Not on file  Social History Narrative  . Not on file   Social Determinants of Health   Financial Resource Strain:   . Difficulty of Paying Living Expenses: Not on file  Food Insecurity:   . Worried About Charity fundraiser in the Last Year: Not on file  . Ran Out of Food in the Last Year: Not on file  Transportation Needs:   . Lack of Transportation (Medical): Not on file  . Lack of Transportation (Non-Medical): Not on file  Physical Activity:   . Days of Exercise per Week: Not on file  . Minutes of Exercise per Session: Not on file  Stress:   . Feeling of Stress : Not on file  Social Connections:   . Frequency of Communication with Friends and Family: Not on file  . Frequency of Social Gatherings with Friends and Family: Not on file  . Attends  Religious Services: Not on file  . Active Member of Clubs or Organizations: Not on file  . Attends Archivist Meetings: Not on file  . Marital Status: Not on file  Intimate Partner Violence:   . Fear of Current or Ex-Partner: Not on file  . Emotionally Abused: Not on file  . Physically Abused: Not on file  . Sexually Abused: Not on file      Review of Systems  Constitutional: Negative for chills, fatigue and unexpected weight change.       Four pound weight loss since her last visit .  HENT: Negative for congestion, postnasal drip, rhinorrhea, sneezing and sore throat.   Respiratory: Negative for cough, chest tightness and shortness of breath.   Cardiovascular:  Negative for chest pain and palpitations.  Gastrointestinal: Negative for abdominal pain, constipation, diarrhea, nausea and vomiting.  Genitourinary: Negative for dysuria and frequency.  Musculoskeletal: Negative for arthralgias, back pain, joint swelling and neck pain.  Skin: Negative for rash.  Allergic/Immunologic: Negative for environmental allergies.  Neurological: Negative for dizziness, tremors, numbness and headaches.  Hematological: Negative for adenopathy. Does not bruise/bleed easily.  Psychiatric/Behavioral: Negative for behavioral problems (Depression), sleep disturbance and suicidal ideas. The patient is not nervous/anxious.     Today's Vitals   03/14/20 1557  BP: 133/74  Pulse: 91  Resp: 16  Temp: 98.7 F (37.1 C)  SpO2: 100%  Weight: 163 lb (73.9 kg)  Height: 5\' 1"  (1.549 m)   Body mass index is 30.8 kg/m.  Physical Exam Vitals and nursing note reviewed.  Constitutional:      General: She is not in acute distress.    Appearance: Normal appearance. She is well-developed. She is not diaphoretic.  HENT:     Head: Normocephalic and atraumatic.     Mouth/Throat:     Pharynx: No oropharyngeal exudate.  Eyes:     Pupils: Pupils are equal, round, and reactive to light.  Neck:     Thyroid: No thyromegaly.     Vascular: No JVD.     Trachea: No tracheal deviation.  Cardiovascular:     Rate and Rhythm: Normal rate and regular rhythm.     Heart sounds: Normal heart sounds. No murmur heard.  No friction rub. No gallop.   Pulmonary:     Effort: Pulmonary effort is normal. No respiratory distress.     Breath sounds: Normal breath sounds. No wheezing or rales.  Chest:     Chest wall: No tenderness.  Abdominal:     Palpations: Abdomen is soft.  Musculoskeletal:        General: Normal range of motion.     Cervical back: Normal range of motion and neck supple.  Lymphadenopathy:     Cervical: No cervical adenopathy.  Skin:    General: Skin is warm and dry.   Neurological:     Mental Status: She is alert and oriented to person, place, and time.     Cranial Nerves: No cranial nerve deficit.  Psychiatric:        Mood and Affect: Mood normal.        Behavior: Behavior normal.        Thought Content: Thought content normal.        Judgment: Judgment normal.    Assessment/Plan: 1. Other fatigue Improving as she continues to lose weight. Will monitor.   2. BMI 30.0-30.9,adult Improving. May continue to take phentermine. Limit calorie intake to 1200 calories per day. Incorporate exercise into daily routine.  -  phentermine (ADIPEX-P) 37.5 MG tablet; Take 1 tablet (37.5 mg total) by mouth daily before breakfast.  Dispense: 30 tablet; Refill: 0  General Counseling: Cathlin verbalizes understanding of the findings of todays visit and agrees with plan of treatment. I have discussed any further diagnostic evaluation that may be needed or ordered today. We also reviewed her medications today. she has been encouraged to call the office with any questions or concerns that should arise related to todays visit.   There is a liability release in patients' chart. There has been a 10 minute discussion about the side effects including but not limited to elevated blood pressure, anxiety, lack of sleep and dry mouth. Pt understands and will like to start/continue on appetite suppressant at this time. There will be one month RX given at the time of visit with proper follow up. Nova diet plan with restricted calories is given to the pt. Pt understands and agrees with  plan of treatment  This patient was seen by Leretha Pol FNP Collaboration with Dr Lavera Guise as a part of collaborative care agreement  Meds ordered this encounter  Medications  . phentermine (ADIPEX-P) 37.5 MG tablet    Sig: Take 1 tablet (37.5 mg total) by mouth daily before breakfast.    Dispense:  30 tablet    Refill:  0    Order Specific Question:   Supervising Provider    Answer:   Lavera Guise [[id number]]    Total time spent: 25 Minutes   Time spent includes review of chart, medications, test results, and follow up plan with the patient.      Dr Lavera Guise Internal medicine

## 2020-03-29 ENCOUNTER — Encounter: Payer: Self-pay | Admitting: Nurse Practitioner

## 2020-03-30 ENCOUNTER — Other Ambulatory Visit: Payer: Self-pay | Admitting: Nurse Practitioner

## 2020-03-30 DIAGNOSIS — B373 Candidiasis of vulva and vagina: Secondary | ICD-10-CM

## 2020-03-30 DIAGNOSIS — B3731 Acute candidiasis of vulva and vagina: Secondary | ICD-10-CM

## 2020-03-30 MED ORDER — FLUCONAZOLE 150 MG PO TABS: ORAL_TABLET | ORAL | 1 refills | Status: DC

## 2020-04-04 ENCOUNTER — Other Ambulatory Visit: Payer: Self-pay | Admitting: Nurse Practitioner

## 2020-04-05 LAB — LIPID PANEL WITH LDL/HDL RATIO
Cholesterol, Total: 196 mg/dL (ref 100–199)
HDL: 55 mg/dL (ref >39)
LDL Chol Calc (NIH): 130 mg/dL — ABNORMAL HIGH (ref 0–99)
LDL/HDL Ratio: 2.4 ratio (ref 0.0–3.2)
Triglycerides: 57 mg/dL (ref 0–149)
VLDL Cholesterol Cal: 11 mg/dL (ref 5–40)

## 2020-04-05 LAB — CBC
Hematocrit: 24.7 % — ABNORMAL LOW (ref 34.0–46.6)
Hemoglobin: 7.1 g/dL — ABNORMAL LOW (ref 11.1–15.9)
MCH: 22.5 pg — ABNORMAL LOW (ref 26.6–33.0)
MCHC: 28.7 g/dL — ABNORMAL LOW (ref 31.5–35.7)
MCV: 78 fL — ABNORMAL LOW (ref 79–97)
Platelets: 533 10*3/uL — ABNORMAL HIGH (ref 150–450)
RBC: 3.15 x10E6/uL — ABNORMAL LOW (ref 3.77–5.28)
RDW: 17.9 % — ABNORMAL HIGH (ref 11.7–15.4)
WBC: 5.7 10*3/uL (ref 3.4–10.8)

## 2020-04-05 LAB — COMPREHENSIVE METABOLIC PANEL
ALT: 5 IU/L (ref 0–32)
AST: 14 IU/L (ref 0–40)
Albumin/Globulin Ratio: 1.5 (ref 1.2–2.2)
Albumin: 4.3 g/dL (ref 3.8–4.8)
Alkaline Phosphatase: 53 IU/L (ref 44–121)
BUN/Creatinine Ratio: 15 (ref 9–23)
BUN: 11 mg/dL (ref 6–20)
Bilirubin Total: <0.2 mg/dL (ref 0.0–1.2)
CO2: 22 mmol/L (ref 20–29)
Calcium: 9.3 mg/dL (ref 8.7–10.2)
Chloride: 102 mmol/L (ref 96–106)
Creatinine, Ser: 0.71 mg/dL (ref 0.57–1.00)
GFR calc Af Amer: 124 mL/min/{1.73_m2} (ref >59)
GFR calc non Af Amer: 108 mL/min/{1.73_m2} (ref >59)
Globulin, Total: 2.9 g/dL (ref 1.5–4.5)
Glucose: 86 mg/dL (ref 65–99)
Potassium: 4.3 mmol/L (ref 3.5–5.2)
Sodium: 136 mmol/L (ref 134–144)
Total Protein: 7.2 g/dL (ref 6.0–8.5)

## 2020-04-05 LAB — HGB A1C W/O EAG: Hgb A1c MFr Bld: 5.6 % (ref 4.8–5.6)

## 2020-04-05 LAB — TSH: TSH: 2.1 u[IU]/mL (ref 0.450–4.500)

## 2020-04-05 LAB — VITAMIN D 25 HYDROXY (VIT D DEFICIENCY, FRACTURES): Vit D, 25-Hydroxy: 12.6 ng/mL — ABNORMAL LOW (ref 30.0–100.0)

## 2020-04-05 LAB — T4, FREE: Free T4: 1.2 ng/dL (ref 0.82–1.77)

## 2020-04-11 ENCOUNTER — Encounter: Payer: Self-pay | Admitting: Nurse Practitioner

## 2020-04-11 ENCOUNTER — Ambulatory Visit: Payer: Medicaid Other | Admitting: Nurse Practitioner

## 2020-04-11 ENCOUNTER — Other Ambulatory Visit: Payer: Self-pay

## 2020-04-11 VITALS — BP 138/83 | HR 74 | Temp 97.5°F | Resp 16 | Ht 61.0 in | Wt 161.0 lb

## 2020-04-11 DIAGNOSIS — Z0001 Encounter for general adult medical examination with abnormal findings: Secondary | ICD-10-CM

## 2020-04-11 DIAGNOSIS — Z23 Encounter for immunization: Secondary | ICD-10-CM

## 2020-04-11 DIAGNOSIS — R300 Dysuria: Secondary | ICD-10-CM

## 2020-04-11 DIAGNOSIS — E559 Vitamin D deficiency, unspecified: Secondary | ICD-10-CM

## 2020-04-11 DIAGNOSIS — D509 Iron deficiency anemia, unspecified: Secondary | ICD-10-CM | POA: Diagnosis not present

## 2020-04-11 MED ORDER — ERGOCALCIFEROL 1.25 MG (50000 UT) PO CAPS: 50000.0000 [IU] | ORAL_CAPSULE | ORAL | 5 refills | Status: DC

## 2020-04-11 NOTE — Progress Notes (Signed)
Vitamin d deficiency. Urgent referral to hematology for iron infusions. Reviewed with patient during visit

## 2020-04-11 NOTE — Progress Notes (Signed)
[facility] [place], [place] [address]  Internal MEDICINE  Office Visit Note  Patient Name: Angie Lamb  [id number]  [id number]  Date of Service: 04/11/2020   Pt is here for routine health maintenance examination    Chief Complaint  Patient presents with  . Annual Exam  . Hypertension  . Quality Metric Gaps    flu  . other    controlled substance form given to PT     The patient is here for health maintenance exam. She states that she does feel fatigued often. She had routine, fasting labs done prior to this visit. Patient has Hgb of 7.1 on. She is having heavier than normal periods. This has been going on since her last child was born. Had to have iron infusions when she was pregnant with her daughter. Baby is 60 months old. Labs also indicate vitamin d deficiency.  She currently takes phentermine. Has had a four pound weight loss since her last visit. She is limiting her calorie intake to 1200 calories per day and exercising routinely. Due to abnormal lab values, we are going to hold this treatment for now. This was discussed with the patient and she is in agreement.   Current Medication: Outpatient Encounter Medications as of 04/11/2020  Medication Sig  . fluconazole (DIFLUCAN) 150 MG tablet Take 1 tablet every three days as needed  . phentermine (ADIPEX-P) 37.5 MG tablet Take 1 tablet (37.5 mg total) by mouth daily before breakfast.  . ergocalciferol (DRISDOL) 1.25 MG (50000 UT) capsule Take 1 capsule (50,000 Units total) by mouth once a week.   No facility-administered encounter medications on file as of 04/11/2020.    Surgical History: Past Surgical History:  Procedure Laterality Date  . CESAREAN SECTION WITH BILATERAL TUBAL LIGATION N/A 02/04/2019   Procedure: CESAREAN SECTION WITH BILATERAL TUBAL LIGATION;  Surgeon: Benjaman Kindler, MD;  Location: [facility] ORS;  Service: Obstetrics;  Laterality: N/A;  . LAPAROSCOPIC GASTRIC SLEEVE RESECTION       Medical History: Past Medical History:  Diagnosis Date  . Gestational diabetes   . Hypertension   . Reflux   . Sleep apnea, obstructive     Family History: Family History  Problem Relation Age of Onset  . Diabetes Paternal Grandmother   . Thyroid disease Mother   . Thyroid disease Father       Review of Systems  Constitutional: Positive for fatigue. Negative for chills and unexpected weight change.       Four pound weight loss since her last visit.   HENT: Negative for congestion, postnasal drip, rhinorrhea, sneezing and sore throat.   Respiratory: Negative for cough, chest tightness, shortness of breath and wheezing.   Cardiovascular: Negative for chest pain and palpitations.  Gastrointestinal: Negative for abdominal pain, constipation, diarrhea, nausea and vomiting.  Endocrine: Negative for cold intolerance, heat intolerance, polydipsia and polyuria.  Genitourinary: Positive for menstrual problem. Negative for decreased urine volume, dysuria and frequency.       Patient reports heavier than usual menstrual cycles since having her last baby who is now 2 months old.   Musculoskeletal: Negative for arthralgias, back pain, joint swelling and neck pain.  Skin: Negative for rash.  Allergic/Immunologic: Negative for environmental allergies.  Neurological: Negative for dizziness, tremors, numbness and headaches.  Hematological: Negative for adenopathy. Does not bruise/bleed easily.  Psychiatric/Behavioral: Negative for behavioral problems (Depression), sleep disturbance and suicidal ideas. The patient is not nervous/anxious.      Today's Vitals  04/11/20 1123  BP: 138/83  Pulse: 74  Resp: 16  Temp: (!) 97.5 F (36.4 C)  SpO2: 94%  Weight: 161 lb (73 kg)  Height: 5' 1"  (1.549 m)   Body mass index is 30.42 kg/m.  Physical Exam Vitals and nursing note reviewed.  Constitutional:      General: She is not in acute distress.    Appearance: Normal appearance. She is  well-developed. She is not diaphoretic.  HENT:     Head: Normocephalic and atraumatic.     Mouth/Throat:     Pharynx: No oropharyngeal exudate.  Eyes:     Pupils: Pupils are equal, round, and reactive to light.  Neck:     Thyroid: No thyromegaly.     Vascular: No JVD.     Trachea: No tracheal deviation.  Cardiovascular:     Rate and Rhythm: Normal rate and regular rhythm.     Pulses: Normal pulses.     Heart sounds: Normal heart sounds. No murmur heard.  No friction rub. No gallop.   Pulmonary:     Effort: Pulmonary effort is normal. No respiratory distress.     Breath sounds: Normal breath sounds. No wheezing or rales.  Chest:     Chest wall: No tenderness.     Breasts:        Right: Normal. No swelling, bleeding, inverted nipple, mass, nipple discharge, skin change or tenderness.        Left: Normal. No swelling, bleeding, inverted nipple, mass, nipple discharge, skin change or tenderness.  Abdominal:     General: Bowel sounds are normal.     Palpations: Abdomen is soft.     Tenderness: There is no abdominal tenderness.  Musculoskeletal:        General: Normal range of motion.     Cervical back: Normal range of motion and neck supple.  Lymphadenopathy:     Cervical: No cervical adenopathy.     Upper Body:     Right upper body: No axillary adenopathy.     Left upper body: No axillary adenopathy.  Skin:    General: Skin is warm and dry.  Neurological:     General: No focal deficit present.     Mental Status: She is alert and oriented to person, place, and time.     Cranial Nerves: No cranial nerve deficit.  Psychiatric:        Mood and Affect: Mood normal.        Behavior: Behavior normal.        Thought Content: Thought content normal.        Judgment: Judgment normal.      LABS: Recent Results (from the past 2160 hour(s))  CULTURE, URINE COMPREHENSIVE     Status: Abnormal   Collection Time: 02/11/20  9:00 AM   Specimen: Urine   Urine  Result Value Ref Range    Urine Culture, Comprehensive Final report (A)    Organism ID, Bacteria Comment (A)     Comment: Staphylococcus saprophyticus 50,000-100,000 colony forming units per mL The CLSI does not advise routine susceptibility testing of urinary tract isolates of Staphylococcus saprophyticus, because acute, uncomplicated urinary tract infections caused by this organism respond to concentrations achieved in urine of antimicrobial agents commonly used to treat these infections, such as nitrofurantoin, a fluoroquinolone, or trimethoprim with or without sulfamethoxazole. CLSI, M100-S15, 2005.   POCT Urinalysis Dipstick     Status: Abnormal   Collection Time: 02/11/20 11:35 AM  Result Value Ref Range  Color, UA     Clarity, UA     Glucose, UA Negative Negative   Bilirubin, UA negative    Ketones, UA 5    Spec Grav, UA 1.010 1.010 - 1.025   Blood, UA large    pH, UA 5.0 5.0 - 8.0   Protein, UA Positive (A) Negative   Urobilinogen, UA 0.2 0.2 or 1.0 E.U./dL   Nitrite, UA negative    Leukocytes, UA Large (3+) (A) Negative   Appearance     Odor    Comprehensive metabolic panel     Status: None   Collection Time: 04/04/20  7:34 AM  Result Value Ref Range   Glucose 86 65 - 99 mg/dL   BUN 11 6 - 20 mg/dL   Creatinine, Ser 0.71 0.57 - 1.00 mg/dL   GFR calc non Af Amer 108 >59 mL/min/1.73   GFR calc Af Amer 124 >59 mL/min/1.73    Comment: **[facility] currently reports eGFR in compliance with the current**   recommendations of the [employer]. [facility] will   update reporting as new guidelines are published from the NKF-ASN   Task force.    BUN/Creatinine Ratio 15 9 - 23   Sodium 136 134 - 144 mmol/L   Potassium 4.3 3.5 - 5.2 mmol/L   Chloride 102 96 - 106 mmol/L   CO2 22 20 - 29 mmol/L   Calcium 9.3 8.7 - 10.2 mg/dL   Total Protein 7.2 6.0 - 8.5 g/dL   Albumin 4.3 3.8 - 4.8 g/dL   Globulin, Total 2.9 1.5 - 4.5 g/dL   Albumin/Globulin Ratio 1.5 1.2 - 2.2   Bilirubin Total  <0.2 0.0 - 1.2 mg/dL   Alkaline Phosphatase 53 44 - 121 IU/L    Comment:               **Please note reference interval change**   AST 14 0 - 40 IU/L   ALT 5 0 - 32 IU/L  CBC     Status: Abnormal   Collection Time: 04/04/20  7:34 AM  Result Value Ref Range   WBC 5.7 3.4 - 10.8 x10E3/uL   RBC 3.15 (L) 3.77 - 5.28 x10E6/uL   Hemoglobin 7.1 (L) 11.1 - 15.9 g/dL   Hematocrit 24.7 (L) 34.0 - 46.6 %   MCV 78 (L) 79 - 97 fL   MCH 22.5 (L) 26.6 - 33.0 pg   MCHC 28.7 (L) 31 - 35 g/dL   RDW 17.9 (H) 11.7 - 15.4 %   Platelets 533 (H) 150 - 450 x10E3/uL  Lipid Panel With LDL/HDL Ratio     Status: Abnormal   Collection Time: 04/04/20  7:34 AM  Result Value Ref Range   Cholesterol, Total 196 100 - 199 mg/dL   Triglycerides 57 0 - 149 mg/dL   HDL 55 >39 mg/dL   VLDL Cholesterol Cal 11 5 - 40 mg/dL   LDL Chol Calc (NIH) 130 (H) 0 - 99 mg/dL   LDL/HDL Ratio 2.4 0.0 - 3.2 ratio    Comment:                                     LDL/HDL Ratio  Men  Women                               1/2 Avg.Risk  1.0    1.5                                   Avg.Risk  3.6    3.2                                2X Avg.Risk  6.2    5.0                                3X Avg.Risk  8.0    6.1   Hgb A1c w/o eAG     Status: None   Collection Time: 04/04/20  7:34 AM  Result Value Ref Range   Hgb A1c MFr Bld 5.6 4.8 - 5.6 %    Comment:          Prediabetes: 5.7 - 6.4          Diabetes: >6.4          Glycemic control for adults with diabetes: <7.0   T4, free     Status: None   Collection Time: 04/04/20  7:34 AM  Result Value Ref Range   Free T4 1.20 0.82 - 1.77 ng/dL  TSH     Status: None   Collection Time: 04/04/20  7:34 AM  Result Value Ref Range   TSH 2.100 0.450 - 4.500 uIU/mL  VITAMIN D 25 Hydroxy (Vit-D Deficiency, Fractures)     Status: Abnormal   Collection Time: 04/04/20  7:34 AM  Result Value Ref Range   Vit D, 25-Hydroxy 12.6 (L) 30.0 - 100.0 ng/mL    Comment:  Vitamin D deficiency has been defined by the [place] and an Endocrine Society practice guideline as a level of serum 25-OH vitamin D less than 20 ng/mL (1,2). The Endocrine Society went on to further define vitamin D insufficiency as a level between 21 and 29 ng/mL (2). 1. IOM (Institute of Medicine). 2010. Dietary reference    intakes for calcium and D. [place]: The    [employer]. 2. Holick MF, Binkley Aubrey, Bischoff-Ferrari HA, et al.    Evaluation, treatment, and prevention of vitamin D    deficiency: an Endocrine Society clinical practice    guideline. JCEM. 2011 Jul; 96(7):1911-30.      Assessment/Plan: 1. Encounter for general adult medical examination with abnormal findings Annual health maintenance exam today.   2. Iron deficiency anemia, unspecified iron deficiency anemia type Hgb 7.1 on recent labs. Unclear etiology, however, patient required iron infusions while pregnant. Refer to hematology for further valuation and treatment.  - Ambulatory referral to Hematology  3. Vitamin D deficiency Drisdol 50000 iu weekly - ergocalciferol (DRISDOL) 1.25 MG (50000 UT) capsule; Take 1 capsule (50,000 Units total) by mouth once a week.  Dispense: 4 capsule; Refill: 5  4. Needs flu shot Flu vaccine administered today.  - Flu Vaccine MDCK QUAD PF  5. Dysuria - UA/M w/rflx Culture, Routine  General Counseling: Laniece verbalizes understanding of the findings of todays visit and agrees with plan of treatment. I have discussed any further diagnostic evaluation that may  be needed or ordered today. We also reviewed her medications today. she has been encouraged to call the office with any questions or concerns that should arise related to todays visit.    Counseling:  This patient was seen by Leretha Pol FNP Collaboration with Dr Lavera Guise as a part of collaborative care agreement  Orders Placed This Encounter  Procedures  . Flu Vaccine MDCK  QUAD PF  . UA/M w/rflx Culture, Routine  . Ambulatory referral to Hematology    Meds ordered this encounter  Medications  . ergocalciferol (DRISDOL) 1.25 MG (50000 UT) capsule    Sig: Take 1 capsule (50,000 Units total) by mouth once a week.    Dispense:  4 capsule    Refill:  5    Order Specific Question:   Supervising Provider    Answer:   Lavera Guise [[id number]]    Total time spent: 28 Minutes  Time spent includes review of chart, medications, test results, and follow up plan with the patient.     Lavera Guise, MD  Internal Medicine

## 2020-04-12 ENCOUNTER — Inpatient Hospital Stay: Payer: Medicaid Other

## 2020-04-12 ENCOUNTER — Inpatient Hospital Stay: Payer: Medicaid Other | Attending: Oncology

## 2020-04-12 ENCOUNTER — Inpatient Hospital Stay (HOSPITAL_BASED_OUTPATIENT_CLINIC_OR_DEPARTMENT_OTHER): Payer: Medicaid Other | Admitting: Oncology

## 2020-04-12 ENCOUNTER — Encounter: Payer: Self-pay | Admitting: Oncology

## 2020-04-12 VITALS — BP 119/77 | HR 85 | Temp 98.4°F | Resp 18

## 2020-04-12 VITALS — BP 120/72 | HR 96 | Temp 98.1°F | Resp 18 | Wt 161.4 lb

## 2020-04-12 DIAGNOSIS — I10 Essential (primary) hypertension: Secondary | ICD-10-CM | POA: Insufficient documentation

## 2020-04-12 DIAGNOSIS — G4700 Insomnia, unspecified: Secondary | ICD-10-CM | POA: Diagnosis not present

## 2020-04-12 DIAGNOSIS — D500 Iron deficiency anemia secondary to blood loss (chronic): Secondary | ICD-10-CM | POA: Diagnosis not present

## 2020-04-12 DIAGNOSIS — D509 Iron deficiency anemia, unspecified: Secondary | ICD-10-CM | POA: Diagnosis not present

## 2020-04-12 DIAGNOSIS — R5383 Other fatigue: Secondary | ICD-10-CM | POA: Insufficient documentation

## 2020-04-12 DIAGNOSIS — D508 Other iron deficiency anemias: Secondary | ICD-10-CM

## 2020-04-12 DIAGNOSIS — N920 Excessive and frequent menstruation with regular cycle: Secondary | ICD-10-CM | POA: Diagnosis not present

## 2020-04-12 DIAGNOSIS — Z79899 Other long term (current) drug therapy: Secondary | ICD-10-CM | POA: Diagnosis not present

## 2020-04-12 LAB — UA/M W/RFLX CULTURE, ROUTINE
Bilirubin, UA: NEGATIVE
Glucose, UA: NEGATIVE
Leukocytes,UA: NEGATIVE
Nitrite, UA: NEGATIVE
Protein,UA: NEGATIVE
RBC, UA: NEGATIVE
Specific Gravity, UA: 1.016 (ref 1.005–1.030)
Urobilinogen, Ur: 0.2 mg/dL (ref 0.2–1.0)
pH, UA: 7.5 (ref 5.0–7.5)

## 2020-04-12 LAB — SAMPLE TO BLOOD BANK

## 2020-04-12 LAB — IRON AND TIBC
Iron: 15 ug/dL — ABNORMAL LOW (ref 28–170)
Saturation Ratios: 3 % — ABNORMAL LOW (ref 10.4–31.8)
TIBC: 483 ug/dL — ABNORMAL HIGH (ref 250–450)
UIBC: 468 ug/dL

## 2020-04-12 LAB — CBC WITH DIFFERENTIAL/PLATELET
Abs Immature Granulocytes: 0.01 10*3/uL (ref 0.00–0.07)
Basophils Absolute: 0 10*3/uL (ref 0.0–0.1)
Basophils Relative: 1 %
Eosinophils Absolute: 0.1 10*3/uL (ref 0.0–0.5)
Eosinophils Relative: 2 %
HCT: 24.7 % — ABNORMAL LOW (ref 36.0–46.0)
Hemoglobin: 7 g/dL — ABNORMAL LOW (ref 12.0–15.0)
Immature Granulocytes: 0 %
Lymphocytes Relative: 29 %
Lymphs Abs: 1.4 10*3/uL (ref 0.7–4.0)
MCH: 23.2 pg — ABNORMAL LOW (ref 26.0–34.0)
MCHC: 28.3 g/dL — ABNORMAL LOW (ref 30.0–36.0)
MCV: 81.8 fL (ref 80.0–100.0)
Monocytes Absolute: 0.3 10*3/uL (ref 0.1–1.0)
Monocytes Relative: 5 %
Neutro Abs: 3.1 10*3/uL (ref 1.7–7.7)
Neutrophils Relative %: 63 %
Platelets: 358 10*3/uL (ref 150–400)
RBC: 3.02 MIL/uL — ABNORMAL LOW (ref 3.87–5.11)
RDW: 19.1 % — ABNORMAL HIGH (ref 11.5–15.5)
WBC: 4.9 10*3/uL (ref 4.0–10.5)
nRBC: 0 % (ref 0.0–0.2)

## 2020-04-12 LAB — MICROSCOPIC EXAMINATION
Bacteria, UA: NONE SEEN
Casts: NONE SEEN /lpf

## 2020-04-12 LAB — FERRITIN: Ferritin: 4 ng/mL — ABNORMAL LOW (ref 11–307)

## 2020-04-12 MED ORDER — IRON SUCROSE 20 MG/ML IV SOLN
200.0000 mg | Freq: Once | INTRAVENOUS | Status: AC
  Administered 2020-04-12: 200 mg via INTRAVENOUS
  Filled 2020-04-12: qty 10

## 2020-04-12 MED ORDER — SODIUM CHLORIDE 0.9 % IV SOLN
Freq: Once | INTRAVENOUS | Status: AC
  Filled 2020-04-12: qty 250

## 2020-04-12 NOTE — Progress Notes (Signed)
Hematology/Oncology Follow Up Note [facility]  [phone] [phone]  Patient Care Team: Ronnell Freshwater, NP as PCP - General (Family Medicine)   Name of the patient: Angie Lamb  [id number]  March 13, 1981   REASON FOR VISIT  follow-up for anemia  INTERVAL HISTORY 39 y.o. female presents for follow-up for iron deficient anemia. Patient was last seen by me in June 2021 via telemedicine.  At that time she was pregnant and had iron deficiency anemia, low hemoglobin despite taking oral iron supplementation.  Patient got IV Venofer treatments and she tolerated well.  She lost follow-up.  She was referred back to reestablish care with me due to profoundly low hemoglobin. She reports feeling very tired and fatigued. CBC on 04/04/2000 showed hemoglobin of 7.1, MCV 78.  Platelet count 533. Reports that her menstrual.  Is heavier than her previous baseline.  Denies seeing any black or bloody stool    Review of Systems  Constitutional: Positive for fatigue. Negative for appetite change, chills and fever.  HENT:   Negative for hearing loss and voice change.   Eyes: Negative for eye problems.  Respiratory: Negative for chest tightness and cough.   Cardiovascular: Negative for chest pain.  Gastrointestinal: Negative for abdominal distention, abdominal pain and blood in stool.  Endocrine: Negative for hot flashes.  Genitourinary: Negative for difficulty urinating and frequency.   Musculoskeletal: Negative for arthralgias.  Skin: Negative for itching and rash.  Neurological: Positive for headaches. Negative for extremity weakness.       Frequent chronic headache  Hematological: Negative for adenopathy.  Psychiatric/Behavioral: Negative for confusion.      Allergies  Allergen Reactions  . Nsaids Other (See Comments)     Past Medical History:  Diagnosis Date  . Gestational diabetes   . Hypertension   . Reflux   . Sleep apnea, obstructive       Past Surgical History:  Procedure Laterality Date  . CESAREAN SECTION WITH BILATERAL TUBAL LIGATION N/A 02/04/2019   Procedure: CESAREAN SECTION WITH BILATERAL TUBAL LIGATION;  Surgeon: Benjaman Kindler, MD;  Location: [facility] ORS;  Service: Obstetrics;  Laterality: N/A;  . LAPAROSCOPIC GASTRIC SLEEVE RESECTION      Social History   Socioeconomic History  . Marital status: Single    Spouse name: Not on file  . Number of children: Not on file  . Years of education: Not on file  . Highest education level: Not on file  Occupational History  . Not on file  Tobacco Use  . Smoking status: Never Smoker  . Smokeless tobacco: Never Used  Vaping Use  . Vaping Use: Never used  Substance and Sexual Activity  . Alcohol use: Yes    Comment: Occasionally every few months   . Drug use: No  . Sexual activity: Yes    Birth control/protection: Surgical  Other Topics Concern  . Not on file  Social History Narrative  . Not on file   Social Determinants of Health   Financial Resource Strain:   . Difficulty of Paying Living Expenses: Not on file  Food Insecurity:   . Worried About Charity fundraiser in the Last Year: Not on file  . Ran Out of Food in the Last Year: Not on file  Transportation Needs:   . Lack of Transportation (Medical): Not on file  . Lack of Transportation (Non-Medical): Not on file  Physical Activity:   . Days of Exercise per Week: Not on file  .  Minutes of Exercise per Session: Not on file  Stress:   . Feeling of Stress : Not on file  Social Connections:   . Frequency of Communication with Friends and Family: Not on file  . Frequency of Social Gatherings with Friends and Family: Not on file  . Attends Religious Services: Not on file  . Active Member of Clubs or Organizations: Not on file  . Attends Archivist Meetings: Not on file  . Marital Status: Not on file  Intimate Partner Violence:   . Fear of Current or Ex-Partner: Not on file  .  Emotionally Abused: Not on file  . Physically Abused: Not on file  . Sexually Abused: Not on file    Family History  Problem Relation Age of Onset  . Diabetes Paternal Grandmother   . Thyroid disease Mother   . Thyroid disease Father      Current Outpatient Medications:  .  ergocalciferol (DRISDOL) 1.25 MG (50000 UT) capsule, Take 1 capsule (50,000 Units total) by mouth once a week., Disp: 4 capsule, Rfl: 5 .  fluconazole (DIFLUCAN) 150 MG tablet, Take 1 tablet every three days as needed (Patient not taking: Reported on 04/12/2020), Disp: 3 tablet, Rfl: 1 .  phentermine (ADIPEX-P) 37.5 MG tablet, Take 1 tablet (37.5 mg total) by mouth daily before breakfast. (Patient not taking: Reported on 04/12/2020), Disp: 30 tablet, Rfl: 0  Physical exam:  Vitals:   04/12/20 1135  BP: 120/72  Pulse: 96  Resp: 18  Temp: 98.1 F (36.7 C)  Weight: 161 lb 6.4 oz (73.2 kg)   Physical Exam Constitutional:      General: She is not in acute distress. HENT:     Head: Normocephalic and atraumatic.  Eyes:     General: No scleral icterus. Cardiovascular:     Rate and Rhythm: Normal rate and regular rhythm.     Heart sounds: Normal heart sounds.  Pulmonary:     Effort: Pulmonary effort is normal. No respiratory distress.     Breath sounds: No wheezing.  Abdominal:     General: Bowel sounds are normal. There is no distension.     Palpations: Abdomen is soft.  Musculoskeletal:        General: No deformity. Normal range of motion.     Cervical back: Normal range of motion and neck supple.  Skin:    General: Skin is warm and dry.     Coloration: Skin is pale.     Findings: No erythema or rash.  Neurological:     Mental Status: She is alert and oriented to person, place, and time. Mental status is at baseline.     Cranial Nerves: No cranial nerve deficit.     Coordination: Coordination normal.  Psychiatric:        Mood and Affect: Mood normal.     CMP Latest Ref Rng & Units 04/04/2020   Glucose 65 - 99 mg/dL 86  BUN 6 - 20 mg/dL 11  Creatinine 0.57 - 1.00 mg/dL 0.71  Sodium 134 - 144 mmol/L 136  Potassium 3.5 - 5.2 mmol/L 4.3  Chloride 96 - 106 mmol/L 102  CO2 20 - 29 mmol/L 22  Calcium 8.7 - 10.2 mg/dL 9.3  Total Protein 6.0 - 8.5 g/dL 7.2  Total Bilirubin 0.0 - 1.2 mg/dL <0.2  Alkaline Phos 44 - 121 IU/L 53  AST 0 - 40 IU/L 14  ALT 0 - 32 IU/L 5   CBC Latest Ref Rng & Units 04/12/2020  WBC 4.0 - 10.5 K/uL 4.9  Hemoglobin 12.0 - 15.0 g/dL 7.0(L)  Hematocrit 36 - 46 % 24.7(L)  Platelets 150 - 400 K/uL 358    RADIOGRAPHIC STUDIES: I have personally reviewed the radiological images as listed and agreed with the findings in the report. No results found.   Assessment and plan  1. Iron deficiency anemia due to chronic blood loss   2. Menorrhagia with regular cycle    #Iron deficiency anemia Labs are reviewed and discussed with patient.  Patient has hemoglobin of 7, borderline MCV.  Iron panel is pending.  Clinically consistent with her previous history of iron deficiency.  Etiology of iron deficiency most likely secondary to chronic blood loss due to heavy menses. Proceed with 1 dose of Venofer 200 mg empirically.  Awaiting iron panel to decide how many additional IV Venofer treatments she needs.  Patient agreed with the plan. Menorrhagia, recommend patient to discuss with gynecology for further evaluation management.   Orders Placed This Encounter  Procedures  . CBC with Differential/Platelet    Standing Status:   Future    Number of Occurrences:   1    Standing Expiration Date:   04/12/2021  . Ferritin    Standing Status:   Future    Number of Occurrences:   1    Standing Expiration Date:   04/12/2021  . Iron and TIBC    Standing Status:   Future    Number of Occurrences:   1    Standing Expiration Date:   04/12/2021  . Sample to Blood Bank    Standing Status:   Future    Number of Occurrences:   1    Standing Expiration Date:   04/12/2021       Follow-up in 8 weeks  Earlie Server, MD, PhD Hematology Oncology [facility] at [facility] Pager- [phone] 04/12/2020

## 2020-04-12 NOTE — Progress Notes (Signed)
Pt here for follow up. Pt reports feeling fatigued and having headaches all the time. Pt had iron patients in the past.

## 2020-04-12 NOTE — Progress Notes (Signed)
reviewed

## 2020-04-12 NOTE — Progress Notes (Signed)
Pt tolerated infusion well. No s/s of distress or reaction noted. Pt and VS stable at discharge.  

## 2020-04-14 ENCOUNTER — Telehealth: Payer: Self-pay | Admitting: Oncology

## 2020-04-14 ENCOUNTER — Telehealth: Payer: Self-pay

## 2020-04-14 NOTE — Telephone Encounter (Signed)
-----   Message from Earlie Server, MD sent at 04/13/2020  9:50 PM EDT ----- Please arrange her to get IV venofer weekly x 3,  Follow up labs in 8 weeks, (ordered), 1-2 days prior to virtual MD

## 2020-04-14 NOTE — Telephone Encounter (Signed)
I sent MyChart message and called patient and left a VM about upcoming appointments. I mailed appts also.

## 2020-04-14 NOTE — Telephone Encounter (Signed)
Patient notified via MyChart message.  Please schedule Venofer *new* weekly for 3 weeks.  Then follow up to see MD in 8 weeks with labs a few days prior.

## 2020-04-14 NOTE — Telephone Encounter (Signed)
Patient prefers a call with appointment details.

## 2020-04-18 ENCOUNTER — Inpatient Hospital Stay: Payer: Medicaid Other

## 2020-04-18 VITALS — BP 126/79 | HR 74 | Temp 99.3°F | Resp 18

## 2020-04-18 DIAGNOSIS — D509 Iron deficiency anemia, unspecified: Secondary | ICD-10-CM | POA: Diagnosis not present

## 2020-04-18 DIAGNOSIS — D508 Other iron deficiency anemias: Secondary | ICD-10-CM

## 2020-04-18 DIAGNOSIS — D500 Iron deficiency anemia secondary to blood loss (chronic): Secondary | ICD-10-CM

## 2020-04-18 MED ORDER — SODIUM CHLORIDE 0.9 % IV SOLN
Freq: Once | INTRAVENOUS | Status: AC
  Filled 2020-04-18: qty 250

## 2020-04-18 MED ORDER — IRON SUCROSE 20 MG/ML IV SOLN
200.0000 mg | Freq: Once | INTRAVENOUS | Status: AC
  Administered 2020-04-18: 200 mg via INTRAVENOUS
  Filled 2020-04-18: qty 10

## 2020-04-18 NOTE — Progress Notes (Signed)
Pt tolerated infusion well. No s/s of distress or reaction noted. Pt and VS stable at discharge.  

## 2020-04-25 ENCOUNTER — Other Ambulatory Visit: Payer: Self-pay

## 2020-04-25 ENCOUNTER — Inpatient Hospital Stay: Payer: Medicaid Other

## 2020-04-25 VITALS — BP 125/63 | HR 86 | Temp 98.9°F | Resp 16

## 2020-04-25 DIAGNOSIS — D508 Other iron deficiency anemias: Secondary | ICD-10-CM

## 2020-04-25 DIAGNOSIS — D509 Iron deficiency anemia, unspecified: Secondary | ICD-10-CM | POA: Diagnosis not present

## 2020-04-25 MED ORDER — SODIUM CHLORIDE 0.9 % IV SOLN
INTRAVENOUS | Status: DC
  Filled 2020-04-25: qty 250

## 2020-04-25 MED ORDER — IRON SUCROSE 20 MG/ML IV SOLN
200.0000 mg | Freq: Once | INTRAVENOUS | Status: AC
  Administered 2020-04-25: 200 mg via INTRAVENOUS
  Filled 2020-04-25: qty 10

## 2020-05-02 ENCOUNTER — Inpatient Hospital Stay: Payer: Medicaid Other

## 2020-05-02 ENCOUNTER — Other Ambulatory Visit: Payer: Self-pay

## 2020-05-02 VITALS — BP 125/84 | HR 94 | Temp 97.8°F | Resp 16

## 2020-05-02 DIAGNOSIS — D509 Iron deficiency anemia, unspecified: Secondary | ICD-10-CM | POA: Diagnosis not present

## 2020-05-02 DIAGNOSIS — D508 Other iron deficiency anemias: Secondary | ICD-10-CM

## 2020-05-02 MED ORDER — IRON SUCROSE 20 MG/ML IV SOLN
200.0000 mg | Freq: Once | INTRAVENOUS | Status: AC
  Administered 2020-05-02: 200 mg via INTRAVENOUS
  Filled 2020-05-02: qty 10

## 2020-05-02 MED ORDER — SODIUM CHLORIDE 0.9 % IV SOLN
Freq: Once | INTRAVENOUS | Status: AC
  Filled 2020-05-02: qty 250

## 2020-06-09 ENCOUNTER — Other Ambulatory Visit: Payer: Self-pay

## 2020-06-09 ENCOUNTER — Inpatient Hospital Stay: Payer: Medicaid Other | Attending: Oncology

## 2020-06-09 DIAGNOSIS — N920 Excessive and frequent menstruation with regular cycle: Secondary | ICD-10-CM | POA: Insufficient documentation

## 2020-06-09 DIAGNOSIS — I10 Essential (primary) hypertension: Secondary | ICD-10-CM | POA: Insufficient documentation

## 2020-06-09 DIAGNOSIS — K219 Gastro-esophageal reflux disease without esophagitis: Secondary | ICD-10-CM | POA: Insufficient documentation

## 2020-06-09 DIAGNOSIS — D509 Iron deficiency anemia, unspecified: Secondary | ICD-10-CM | POA: Insufficient documentation

## 2020-06-09 DIAGNOSIS — Z79899 Other long term (current) drug therapy: Secondary | ICD-10-CM | POA: Diagnosis not present

## 2020-06-09 DIAGNOSIS — G4730 Sleep apnea, unspecified: Secondary | ICD-10-CM | POA: Insufficient documentation

## 2020-06-09 DIAGNOSIS — R5383 Other fatigue: Secondary | ICD-10-CM | POA: Diagnosis not present

## 2020-06-09 DIAGNOSIS — D500 Iron deficiency anemia secondary to blood loss (chronic): Secondary | ICD-10-CM

## 2020-06-09 LAB — CBC WITH DIFFERENTIAL/PLATELET
Abs Immature Granulocytes: 0.01 10*3/uL (ref 0.00–0.07)
Basophils Absolute: 0 10*3/uL (ref 0.0–0.1)
Basophils Relative: 1 %
Eosinophils Absolute: 0.1 10*3/uL (ref 0.0–0.5)
Eosinophils Relative: 3 %
HCT: 34.3 % — ABNORMAL LOW (ref 36.0–46.0)
Hemoglobin: 10.5 g/dL — ABNORMAL LOW (ref 12.0–15.0)
Immature Granulocytes: 0 %
Lymphocytes Relative: 35 %
Lymphs Abs: 1.6 10*3/uL (ref 0.7–4.0)
MCH: 28.3 pg (ref 26.0–34.0)
MCHC: 30.6 g/dL (ref 30.0–36.0)
MCV: 92.5 fL (ref 80.0–100.0)
Monocytes Absolute: 0.2 10*3/uL (ref 0.1–1.0)
Monocytes Relative: 5 %
Neutro Abs: 2.5 10*3/uL (ref 1.7–7.7)
Neutrophils Relative %: 56 %
Platelets: 307 10*3/uL (ref 150–400)
RBC: 3.71 MIL/uL — ABNORMAL LOW (ref 3.87–5.11)
RDW: 17.4 % — ABNORMAL HIGH (ref 11.5–15.5)
WBC: 4.5 10*3/uL (ref 4.0–10.5)
nRBC: 0 % (ref 0.0–0.2)

## 2020-06-09 LAB — FERRITIN: Ferritin: 16 ng/mL (ref 11–307)

## 2020-06-09 LAB — IRON AND TIBC
Iron: 68 ug/dL (ref 28–170)
Saturation Ratios: 16 % (ref 10.4–31.8)
TIBC: 427 ug/dL (ref 250–450)
UIBC: 359 ug/dL

## 2020-06-12 ENCOUNTER — Ambulatory Visit: Payer: Medicaid Other | Admitting: Nurse Practitioner

## 2020-06-13 ENCOUNTER — Inpatient Hospital Stay (HOSPITAL_BASED_OUTPATIENT_CLINIC_OR_DEPARTMENT_OTHER): Payer: Medicaid Other | Admitting: Oncology

## 2020-06-13 ENCOUNTER — Inpatient Hospital Stay: Payer: Medicaid Other

## 2020-06-13 ENCOUNTER — Encounter: Payer: Self-pay | Admitting: Oncology

## 2020-06-13 VITALS — BP 103/62 | HR 71 | Temp 97.8°F | Resp 18 | Wt 175.0 lb

## 2020-06-13 VITALS — BP 113/74 | HR 66 | Resp 16

## 2020-06-13 DIAGNOSIS — D508 Other iron deficiency anemias: Secondary | ICD-10-CM | POA: Diagnosis not present

## 2020-06-13 DIAGNOSIS — D509 Iron deficiency anemia, unspecified: Secondary | ICD-10-CM | POA: Diagnosis not present

## 2020-06-13 MED ORDER — IRON SUCROSE 20 MG/ML IV SOLN
200.0000 mg | Freq: Once | INTRAVENOUS | Status: AC
  Administered 2020-06-13: 200 mg via INTRAVENOUS
  Filled 2020-06-13: qty 10

## 2020-06-13 NOTE — Progress Notes (Signed)
Hematology/Oncology Follow Up Note [facility]  [phone] [phone]  Patient Care Team: Ronnell Freshwater, NP as PCP - General (Family Medicine)   Name of the patient: Angie Lamb  [id number]  April 01, 1981   REASON FOR VISIT  follow-up for anemia  PERTINENT HEMATOLOGY HISTORY 39 y.o. female presents for follow-up for iron deficient anemia. Patient was last seen by me in June 2021 via telemedicine.  At that time she was pregnant and had iron deficiency anemia, low hemoglobin despite taking oral iron supplementation.  Patient got IV Venofer treatments and she tolerated well.  She lost follow-up.  She was referred back to reestablish care with me due to profoundly low hemoglobin. She reports feeling very tired and fatigued. CBC on 04/04/2000 showed hemoglobin of 7.1, MCV 78.  Platelet count 533. Reports that her menstrual.  Is heavier than her previous baseline.  Denies seeing any black or bloody stool  INTERVAL HISTORY Angie Lamb is a 39 y.o. female who has above history reviewed by me today presents for follow up visit for management of iron deficiency anemia. Problems and complaints are listed below: Patient reports improvement after receiving previous IV Venofer treatments.  Fatigue has improved.  Review of Systems  Constitutional: Negative for appetite change, chills, fatigue and fever.  HENT:   Negative for hearing loss and voice change.   Eyes: Negative for eye problems.  Respiratory: Negative for chest tightness and cough.   Cardiovascular: Negative for chest pain.  Gastrointestinal: Negative for abdominal distention, abdominal pain and blood in stool.  Endocrine: Negative for hot flashes.  Genitourinary: Positive for menstrual problem. Negative for difficulty urinating and frequency.   Musculoskeletal: Negative for arthralgias.  Skin: Negative for itching and rash.  Neurological: Negative for extremity weakness.        Frequent chronic headache  Hematological: Negative for adenopathy.  Psychiatric/Behavioral: Negative for confusion.      Allergies  Allergen Reactions  . Nsaids Other (See Comments)     Past Medical History:  Diagnosis Date  . Gestational diabetes   . Hypertension   . Reflux   . Sleep apnea, obstructive      Past Surgical History:  Procedure Laterality Date  . CESAREAN SECTION WITH BILATERAL TUBAL LIGATION N/A 02/04/2019   Procedure: CESAREAN SECTION WITH BILATERAL TUBAL LIGATION;  Surgeon: Benjaman Kindler, MD;  Location: [facility] ORS;  Service: Obstetrics;  Laterality: N/A;  . LAPAROSCOPIC GASTRIC SLEEVE RESECTION      Social History   Socioeconomic History  . Marital status: Single    Spouse name: Not on file  . Number of children: Not on file  . Years of education: Not on file  . Highest education level: Not on file  Occupational History  . Not on file  Tobacco Use  . Smoking status: Never Smoker  . Smokeless tobacco: Never Used  Vaping Use  . Vaping Use: Never used  Substance and Sexual Activity  . Alcohol use: Yes    Comment: Occasionally every few months   . Drug use: No  . Sexual activity: Yes    Birth control/protection: Surgical  Other Topics Concern  . Not on file  Social History Narrative  . Not on file   Social Determinants of Health   Financial Resource Strain:   . Difficulty of Paying Living Expenses: Not on file  Food Insecurity:   . Worried About Charity fundraiser in the Last Year: Not on file  . Ran Out  of Food in the Last Year: Not on file  Transportation Needs:   . Lack of Transportation (Medical): Not on file  . Lack of Transportation (Non-Medical): Not on file  Physical Activity:   . Days of Exercise per Week: Not on file  . Minutes of Exercise per Session: Not on file  Stress:   . Feeling of Stress : Not on file  Social Connections:   . Frequency of Communication with Friends and Family: Not on file  . Frequency of Social  Gatherings with Friends and Family: Not on file  . Attends Religious Services: Not on file  . Active Member of Clubs or Organizations: Not on file  . Attends Archivist Meetings: Not on file  . Marital Status: Not on file  Intimate Partner Violence:   . Fear of Current or Ex-Partner: Not on file  . Emotionally Abused: Not on file  . Physically Abused: Not on file  . Sexually Abused: Not on file    Family History  Problem Relation Age of Onset  . Diabetes Paternal Grandmother   . Thyroid disease Mother   . Thyroid disease Father      Current Outpatient Medications:  .  ergocalciferol (DRISDOL) 1.25 MG (50000 UT) capsule, Take 1 capsule (50,000 Units total) by mouth once a week., Disp: 4 capsule, Rfl: 5 .  fluconazole (DIFLUCAN) 150 MG tablet, Take 1 tablet every three days as needed (Patient not taking: Reported on 04/12/2020), Disp: 3 tablet, Rfl: 1 .  phentermine (ADIPEX-P) 37.5 MG tablet, Take 1 tablet (37.5 mg total) by mouth daily before breakfast. (Patient not taking: Reported on 04/12/2020), Disp: 30 tablet, Rfl: 0  Physical exam:  Vitals:   06/13/20 1321  BP: 103/62  Pulse: 71  Resp: 18  Temp: 97.8 F (36.6 C)  TempSrc: Tympanic  SpO2: 100%  Weight: 175 lb (79.4 kg)   Physical Exam Constitutional:      General: She is not in acute distress. HENT:     Head: Normocephalic and atraumatic.  Eyes:     General: No scleral icterus. Cardiovascular:     Rate and Rhythm: Normal rate and regular rhythm.     Heart sounds: Normal heart sounds.  Pulmonary:     Effort: Pulmonary effort is normal. No respiratory distress.     Breath sounds: No wheezing.  Abdominal:     General: Bowel sounds are normal. There is no distension.     Palpations: Abdomen is soft.  Musculoskeletal:        General: No deformity. Normal range of motion.     Cervical back: Normal range of motion and neck supple.  Skin:    General: Skin is warm and dry.     Coloration: Skin is not pale.      Findings: No erythema or rash.  Neurological:     Mental Status: She is alert and oriented to person, place, and time. Mental status is at baseline.     Cranial Nerves: No cranial nerve deficit.     Coordination: Coordination normal.  Psychiatric:        Mood and Affect: Mood normal.     CMP Latest Ref Rng & Units 04/04/2020  Glucose 65 - 99 mg/dL 86  BUN 6 - 20 mg/dL 11  Creatinine 0.57 - 1.00 mg/dL 0.71  Sodium 134 - 144 mmol/L 136  Potassium 3.5 - 5.2 mmol/L 4.3  Chloride 96 - 106 mmol/L 102  CO2 20 - 29 mmol/L 22  Calcium  8.7 - 10.2 mg/dL 9.3  Total Protein 6.0 - 8.5 g/dL 7.2  Total Bilirubin 0.0 - 1.2 mg/dL <0.2  Alkaline Phos 44 - 121 IU/L 53  AST 0 - 40 IU/L 14  ALT 0 - 32 IU/L 5   CBC Latest Ref Rng & Units 06/09/2020  WBC 4.0 - 10.5 K/uL 4.5  Hemoglobin 12.0 - 15.0 g/dL 10.5(L)  Hematocrit 36 - 46 % 34.3(L)  Platelets 150 - 400 K/uL 307    RADIOGRAPHIC STUDIES: I have personally reviewed the radiological images as listed and agreed with the findings in the report. No results found.   Assessment and plan  1. Other iron deficiency anemia    #Iron deficiency anemia Labs were reviewed and discussed with patient.  Hemoglobin as well as iron store have both improved after his IV Venofer treatments. Hemoglobin has not yet normalized yet. Given her ongoing heavy menses, I recommend to proceed with additional one dose of IV Venofer 200 mg today. She may take oral iron supplementation for maintenance.  #Heavy menses, patient reports that she has fibroid disease.  I recommend patient to follow-up with gynecology for further evaluation.  Orders Placed This Encounter  Procedures  . CBC with Differential/Platelet    Standing Status:   Future    Standing Expiration Date:   06/13/2021  . Ferritin    Standing Status:   Future    Standing Expiration Date:   06/13/2021  . Iron and TIBC    Standing Status:   Future    Standing Expiration Date:   06/13/2021       Follow-up in 3 months  Earlie Server, MD, PhD Hematology Oncology [facility] Pager- [phone] 06/13/2020

## 2020-06-13 NOTE — Progress Notes (Signed)
Venofer well tolerated, VSS. Discharged home in stable condition.

## 2020-09-13 ENCOUNTER — Inpatient Hospital Stay: Payer: Medicaid Other

## 2020-09-15 ENCOUNTER — Ambulatory Visit: Payer: Medicaid Other

## 2020-09-15 ENCOUNTER — Ambulatory Visit: Payer: Medicaid Other | Admitting: Oncology

## 2020-10-02 ENCOUNTER — Inpatient Hospital Stay: Payer: Medicaid Other | Attending: Oncology

## 2020-10-02 DIAGNOSIS — G4730 Sleep apnea, unspecified: Secondary | ICD-10-CM | POA: Diagnosis not present

## 2020-10-02 DIAGNOSIS — I10 Essential (primary) hypertension: Secondary | ICD-10-CM | POA: Insufficient documentation

## 2020-10-02 DIAGNOSIS — D508 Other iron deficiency anemias: Secondary | ICD-10-CM

## 2020-10-02 DIAGNOSIS — D509 Iron deficiency anemia, unspecified: Secondary | ICD-10-CM | POA: Diagnosis not present

## 2020-10-02 DIAGNOSIS — K219 Gastro-esophageal reflux disease without esophagitis: Secondary | ICD-10-CM | POA: Insufficient documentation

## 2020-10-02 DIAGNOSIS — R5383 Other fatigue: Secondary | ICD-10-CM | POA: Diagnosis not present

## 2020-10-02 LAB — CBC WITH DIFFERENTIAL/PLATELET
Abs Immature Granulocytes: 0.01 10*3/uL (ref 0.00–0.07)
Basophils Absolute: 0 10*3/uL (ref 0.0–0.1)
Basophils Relative: 1 %
Eosinophils Absolute: 0.2 10*3/uL (ref 0.0–0.5)
Eosinophils Relative: 4 %
HCT: 31 % — ABNORMAL LOW (ref 36.0–46.0)
Hemoglobin: 9.7 g/dL — ABNORMAL LOW (ref 12.0–15.0)
Immature Granulocytes: 0 %
Lymphocytes Relative: 32 %
Lymphs Abs: 1.9 10*3/uL (ref 0.7–4.0)
MCH: 29 pg (ref 26.0–34.0)
MCHC: 31.3 g/dL (ref 30.0–36.0)
MCV: 92.5 fL (ref 80.0–100.0)
Monocytes Absolute: 0.3 10*3/uL (ref 0.1–1.0)
Monocytes Relative: 5 %
Neutro Abs: 3.4 10*3/uL (ref 1.7–7.7)
Neutrophils Relative %: 58 %
Platelets: 267 10*3/uL (ref 150–400)
RBC: 3.35 MIL/uL — ABNORMAL LOW (ref 3.87–5.11)
RDW: 12.3 % (ref 11.5–15.5)
WBC: 5.8 10*3/uL (ref 4.0–10.5)
nRBC: 0 % (ref 0.0–0.2)

## 2020-10-02 LAB — IRON AND TIBC
Iron: 28 ug/dL (ref 28–170)
Saturation Ratios: 5 % — ABNORMAL LOW (ref 10.4–31.8)
TIBC: 532 ug/dL — ABNORMAL HIGH (ref 250–450)
UIBC: 504 ug/dL

## 2020-10-02 LAB — FERRITIN: Ferritin: 1 ng/mL — ABNORMAL LOW (ref 11–307)

## 2020-10-03 ENCOUNTER — Inpatient Hospital Stay: Payer: Medicaid Other

## 2020-10-03 ENCOUNTER — Encounter: Payer: Self-pay | Admitting: Oncology

## 2020-10-03 ENCOUNTER — Inpatient Hospital Stay (HOSPITAL_BASED_OUTPATIENT_CLINIC_OR_DEPARTMENT_OTHER): Payer: Medicaid Other | Admitting: Oncology

## 2020-10-03 VITALS — BP 115/73 | HR 87 | Temp 99.1°F | Resp 18 | Wt 179.7 lb

## 2020-10-03 VITALS — BP 124/79 | HR 82 | Temp 97.9°F | Resp 16

## 2020-10-03 DIAGNOSIS — D500 Iron deficiency anemia secondary to blood loss (chronic): Secondary | ICD-10-CM

## 2020-10-03 DIAGNOSIS — D508 Other iron deficiency anemias: Secondary | ICD-10-CM

## 2020-10-03 DIAGNOSIS — D509 Iron deficiency anemia, unspecified: Secondary | ICD-10-CM | POA: Diagnosis not present

## 2020-10-03 MED ORDER — IRON SUCROSE 20 MG/ML IV SOLN
200.0000 mg | Freq: Once | INTRAVENOUS | Status: AC
  Administered 2020-10-03: 200 mg via INTRAVENOUS
  Filled 2020-10-03: qty 10

## 2020-10-03 NOTE — Progress Notes (Signed)
Hematology/Oncology Follow Up Note [facility]  Telephone:([phone] [phone]  Patient Care Team: Ronnell Freshwater, NP as PCP - General (Family Medicine)   Name of the patient: Angie Lamb  [id number]  Oct 17, 1980   REASON FOR VISIT  follow-up for anemia  PERTINENT HEMATOLOGY HISTORY 40 y.o. female presents for follow-up for iron deficient anemia. Patient was last seen by me in June 2021 via telemedicine.  At that time she was pregnant and had iron deficiency anemia, low hemoglobin despite taking oral iron supplementation.  Patient got IV Venofer treatments and she tolerated well.  She lost follow-up.  She was referred back to reestablish care with me due to profoundly low hemoglobin. She reports feeling very tired and fatigued. CBC on 04/04/2000 showed hemoglobin of 7.1, MCV 78.  Platelet count 533. Reports that her menstrual.  Is heavier than her previous baseline.  Denies seeing any black or bloody stool  INTERVAL HISTORY Angie Lamb is a 40 y.o. female who has above history reviewed by me today presents for follow up visit for management of iron deficiency anemia. Problems and complaints are listed below: Patient reports feeling more fatigued.  Continues to have heavy cycles.  Patient has had IUD placed  Review of Systems  Constitutional: Negative for appetite change, chills, fatigue and fever.  HENT:   Negative for hearing loss and voice change.   Eyes: Negative for eye problems.  Respiratory: Negative for chest tightness and cough.   Cardiovascular: Negative for chest pain.  Gastrointestinal: Negative for abdominal distention, abdominal pain and blood in stool.  Endocrine: Negative for hot flashes.  Genitourinary: Positive for menstrual problem. Negative for difficulty urinating and frequency.   Musculoskeletal: Negative for arthralgias.  Skin: Negative for itching and rash.  Neurological: Negative for extremity weakness.        Frequent chronic headache  Hematological: Negative for adenopathy.  Psychiatric/Behavioral: Negative for confusion.      Allergies  Allergen Reactions  . Nsaids Other (See Comments)     Past Medical History:  Diagnosis Date  . Gestational diabetes   . Hypertension   . Reflux   . Sleep apnea, obstructive      Past Surgical History:  Procedure Laterality Date  . CESAREAN SECTION WITH BILATERAL TUBAL LIGATION N/A 02/04/2019   Procedure: CESAREAN SECTION WITH BILATERAL TUBAL LIGATION;  Surgeon: Benjaman Kindler, MD;  Location: [facility] ORS;  Service: Obstetrics;  Laterality: N/A;  . LAPAROSCOPIC GASTRIC SLEEVE RESECTION      Social History   Socioeconomic History  . Marital status: Single    Spouse name: Not on file  . Number of children: Not on file  . Years of education: Not on file  . Highest education level: Not on file  Occupational History  . Not on file  Tobacco Use  . Smoking status: Never Smoker  . Smokeless tobacco: Never Used  Vaping Use  . Vaping Use: Never used  Substance and Sexual Activity  . Alcohol use: Yes    Comment: Occasionally every few months   . Drug use: No  . Sexual activity: Yes    Birth control/protection: Surgical  Other Topics Concern  . Not on file  Social History Narrative  . Not on file   Social Determinants of Health   Financial Resource Strain: Not on file  Food Insecurity: Not on file  Transportation Needs: Not on file  Physical Activity: Not on file  Stress: Not on file  Social Connections: Not  on file  Intimate Partner Violence: Not on file    Family History  Problem Relation Age of Onset  . Diabetes Paternal Grandmother   . Thyroid disease Mother   . Thyroid disease Father      Current Outpatient Medications:  .  ergocalciferol (DRISDOL) 1.25 MG (50000 UT) capsule, Take 1 capsule (50,000 Units total) by mouth once a week., Disp: 4 capsule, Rfl: 5  Physical exam:  Vitals:   10/03/20 1315  BP: 115/73  Pulse:  87  Resp: 18  Temp: 99.1 F (37.3 C)  Weight: 179 lb 11.2 oz (81.5 kg)   Physical Exam Constitutional:      General: She is not in acute distress. HENT:     Head: Normocephalic and atraumatic.  Eyes:     General: No scleral icterus. Cardiovascular:     Rate and Rhythm: Normal rate and regular rhythm.     Heart sounds: Normal heart sounds.  Pulmonary:     Effort: Pulmonary effort is normal. No respiratory distress.     Breath sounds: No wheezing.  Abdominal:     General: Bowel sounds are normal. There is no distension.     Palpations: Abdomen is soft.  Musculoskeletal:        General: No deformity. Normal range of motion.     Cervical back: Normal range of motion and neck supple.  Skin:    General: Skin is warm and dry.     Coloration: Skin is not pale.     Findings: No erythema or rash.  Neurological:     Mental Status: She is alert and oriented to person, place, and time. Mental status is at baseline.     Cranial Nerves: No cranial nerve deficit.     Coordination: Coordination normal.  Psychiatric:        Mood and Affect: Mood normal.     CMP Latest Ref Rng & Units 04/04/2020  Glucose 65 - 99 mg/dL 86  BUN 6 - 20 mg/dL 11  Creatinine 0.57 - 1.00 mg/dL 0.71  Sodium 134 - 144 mmol/L 136  Potassium 3.5 - 5.2 mmol/L 4.3  Chloride 96 - 106 mmol/L 102  CO2 20 - 29 mmol/L 22  Calcium 8.7 - 10.2 mg/dL 9.3  Total Protein 6.0 - 8.5 g/dL 7.2  Total Bilirubin 0.0 - 1.2 mg/dL <0.2  Alkaline Phos 44 - 121 IU/L 53  AST 0 - 40 IU/L 14  ALT 0 - 32 IU/L 5   CBC Latest Ref Rng & Units 10/02/2020  WBC 4.0 - 10.5 K/uL 5.8  Hemoglobin 12.0 - 15.0 g/dL 9.7(L)  Hematocrit 36.0 - 46.0 % 31.0(L)  Platelets 150 - 400 K/uL 267    RADIOGRAPHIC STUDIES: I have personally reviewed the radiological images as listed and agreed with the findings in the report. No results found.   Assessment and plan  1. Iron deficiency anemia due to chronic blood loss    #Iron deficiency anemia due  to chronic blood loss. Labs are reviewed and discussed with patient. Severe iron deficiency with ferritin 1, iron saturation 5%.  Hemoglobin has decreased to 9.7. Given that she continues to have heavy cycles due to fibroid disease, recommend patient to proceed with IV Venofer 200 mg weekly x4.  Recommend patient to follow-up in 8 weeks for evaluation of need of additional IV iron.   Orders Placed This Encounter  Procedures  . CBC with Differential/Platelet    Standing Status:   Future    Standing Expiration  Date:   10/03/2021  . Ferritin    Standing Status:   Future    Standing Expiration Date:   10/03/2021  . Iron and TIBC    Standing Status:   Future    Standing Expiration Date:   10/03/2021     Earlie Server, MD, PhD Hematology Oncology [facility] Pager- [phone] 10/03/2020

## 2020-10-03 NOTE — Progress Notes (Signed)
Patient here for follow up. No new concerns voiced.  °

## 2020-10-10 ENCOUNTER — Inpatient Hospital Stay: Payer: Medicaid Other | Attending: Oncology

## 2020-10-10 VITALS — BP 123/82 | HR 81 | Temp 97.6°F

## 2020-10-10 DIAGNOSIS — Z79899 Other long term (current) drug therapy: Secondary | ICD-10-CM | POA: Insufficient documentation

## 2020-10-10 DIAGNOSIS — D508 Other iron deficiency anemias: Secondary | ICD-10-CM

## 2020-10-10 DIAGNOSIS — D509 Iron deficiency anemia, unspecified: Secondary | ICD-10-CM | POA: Diagnosis not present

## 2020-10-10 MED ORDER — SODIUM CHLORIDE 0.9 % IV SOLN
INTRAVENOUS | Status: DC
  Filled 2020-10-10: qty 250

## 2020-10-10 MED ORDER — SODIUM CHLORIDE 0.9 % IV SOLN: 200.0000 mg | Freq: Once | INTRAVENOUS | Status: DC

## 2020-10-10 MED ORDER — IRON SUCROSE 20 MG/ML IV SOLN
200.0000 mg | Freq: Once | INTRAVENOUS | Status: AC
  Administered 2020-10-10: 200 mg via INTRAVENOUS
  Filled 2020-10-10: qty 10

## 2020-10-10 NOTE — Addendum Note (Signed)
Addended by: Earlie Server on: 10/10/2020 02:29 PM   Modules accepted: Orders

## 2020-10-17 ENCOUNTER — Inpatient Hospital Stay: Payer: Medicaid Other

## 2020-10-17 VITALS — BP 124/66 | HR 77 | Temp 97.3°F

## 2020-10-17 DIAGNOSIS — D509 Iron deficiency anemia, unspecified: Secondary | ICD-10-CM | POA: Diagnosis not present

## 2020-10-17 DIAGNOSIS — D508 Other iron deficiency anemias: Secondary | ICD-10-CM

## 2020-10-17 MED ORDER — SODIUM CHLORIDE 0.9 % IV SOLN
Freq: Once | INTRAVENOUS | Status: AC
  Filled 2020-10-17: qty 250

## 2020-10-17 MED ORDER — IRON SUCROSE 20 MG/ML IV SOLN
200.0000 mg | Freq: Once | INTRAVENOUS | Status: AC
  Administered 2020-10-17: 200 mg via INTRAVENOUS
  Filled 2020-10-17: qty 10

## 2020-10-17 MED ORDER — SODIUM CHLORIDE 0.9 % IV SOLN: 200.0000 mg | Freq: Once | INTRAVENOUS | Status: DC

## 2020-10-24 ENCOUNTER — Inpatient Hospital Stay: Payer: Medicaid Other

## 2020-10-24 VITALS — BP 122/69 | HR 81 | Temp 97.4°F

## 2020-10-24 DIAGNOSIS — D508 Other iron deficiency anemias: Secondary | ICD-10-CM

## 2020-10-24 DIAGNOSIS — D509 Iron deficiency anemia, unspecified: Secondary | ICD-10-CM | POA: Diagnosis not present

## 2020-10-24 MED ORDER — SODIUM CHLORIDE 0.9 % IV SOLN
Freq: Once | INTRAVENOUS | Status: AC
  Filled 2020-10-24: qty 250

## 2020-10-24 MED ORDER — SODIUM CHLORIDE 0.9 % IV SOLN: 200.0000 mg | Freq: Once | INTRAVENOUS | Status: DC

## 2020-10-24 MED ORDER — IRON SUCROSE 20 MG/ML IV SOLN
200.0000 mg | Freq: Once | INTRAVENOUS | Status: AC
  Administered 2020-10-24: 200 mg via INTRAVENOUS
  Filled 2020-10-24: qty 10

## 2020-11-27 ENCOUNTER — Inpatient Hospital Stay: Payer: Medicaid Other

## 2020-11-29 ENCOUNTER — Inpatient Hospital Stay: Payer: Medicaid Other | Admitting: Oncology

## 2020-11-29 ENCOUNTER — Encounter: Payer: Self-pay | Admitting: Oncology

## 2020-11-30 ENCOUNTER — Inpatient Hospital Stay: Payer: Medicaid Other

## 2020-12-18 ENCOUNTER — Telehealth: Payer: Self-pay | Admitting: Oncology

## 2020-12-18 NOTE — Telephone Encounter (Signed)
Patient states she has to get labs at 745 on 6/15 for Dr. Ouida Sills at [facility] and is asking if we can coordinate and have the labs that we need drawn for Dr. Tasia Catchings at the same time so that she does not have to come for the 130pm lab appointment.  Routing to clinical team for follow up.

## 2020-12-18 NOTE — Telephone Encounter (Signed)
Called Dr. Tonette Bihari office to see if we could send an order for patient to have labs drawn in their lab, cbc and ferritin is ordered to be drawn at [facility] but not an iron & TIBC.  Dr. Tasia Catchings is a [facility] provider they can not accept orders for iron & TIBC to be drawn as well.  I informed patient and she said she will contact Dr Schermerhorn's office and ask if her will order the iron & TIBC.

## 2020-12-19 NOTE — Telephone Encounter (Signed)
MyChart message sent to patient for an update if Dr. Ouida Sills will order the iron & TIBC lab.  If  not able to have the lab order by Dr. Ouida Sills will check with Dr. Tasia Catchings if she wants patient to come in to have iron & TIBC drawn here.

## 2020-12-20 ENCOUNTER — Inpatient Hospital Stay: Payer: Medicaid Other

## 2020-12-22 ENCOUNTER — Inpatient Hospital Stay: Payer: Medicaid Other | Attending: Oncology | Admitting: Oncology

## 2020-12-22 ENCOUNTER — Encounter: Payer: Self-pay | Admitting: Oncology

## 2020-12-22 DIAGNOSIS — D500 Iron deficiency anemia secondary to blood loss (chronic): Secondary | ICD-10-CM | POA: Diagnosis not present

## 2020-12-22 DIAGNOSIS — N920 Excessive and frequent menstruation with regular cycle: Secondary | ICD-10-CM | POA: Diagnosis not present

## 2020-12-22 MED ORDER — POLYSACCHARIDE IRON COMPLEX 150 MG PO CAPS: 150.0000 mg | ORAL_CAPSULE | Freq: Every day | ORAL | 3 refills | Status: DC

## 2020-12-22 NOTE — Progress Notes (Signed)
Pt contacted for Mychart visit. No new concerns voiced.  

## 2020-12-22 NOTE — Progress Notes (Signed)
HEMATOLOGY-ONCOLOGY TeleHEALTH VISIT PROGRESS NOTE  I connected with Angie Lamb on 12/22/20  at 12:00 PM EDT by video enabled telemedicine visit and verified that I am speaking with the correct person using two identifiers. I discussed the limitations, risks, security and privacy concerns of performing an evaluation and management service by telemedicine and the availability of in-person appointments. The patient expressed understanding and agreed to proceed.   Other persons participating in the visit and their role in the encounter:  None  Patient's location: Home  Provider's location: office Chief Complaint: follow up for Angie Lamb is a 40 y.o. female who has above history reviewed by me today presents for follow up visit for management of IDA Problems and complaints are listed below:  Patient has had IV Venofer treatments.  Reports energy is about the same.  Review of Systems  Constitutional:  Positive for fatigue. Negative for appetite change, chills and fever.  HENT:   Negative for hearing loss and voice change.   Eyes:  Negative for eye problems.  Respiratory:  Negative for chest tightness and cough.   Cardiovascular:  Negative for chest pain.  Gastrointestinal:  Negative for abdominal distention, abdominal pain and blood in stool.  Endocrine: Negative for hot flashes.  Genitourinary:  Negative for difficulty urinating and frequency.   Musculoskeletal:  Negative for arthralgias.  Skin:  Negative for itching and rash.  Neurological:  Negative for extremity weakness.  Hematological:  Negative for adenopathy.  Psychiatric/Behavioral:  Negative for confusion.    Past Medical History:  Diagnosis Date   Gestational diabetes    Hypertension    Reflux    Sleep apnea, obstructive    Past Surgical History:  Procedure Laterality Date   CESAREAN SECTION WITH BILATERAL TUBAL LIGATION N/A 02/04/2019   Procedure: CESAREAN SECTION WITH BILATERAL TUBAL  LIGATION;  Surgeon: Benjaman Kindler, MD;  Location: [facility] ORS;  Service: Obstetrics;  Laterality: N/A;   LAPAROSCOPIC GASTRIC SLEEVE RESECTION      Family History  Problem Relation Age of Onset   Diabetes Paternal Grandmother    Thyroid disease Mother    Thyroid disease Father     Social History   Socioeconomic History   Marital status: Single    Spouse name: Not on file   Number of children: Not on file   Years of education: Not on file   Highest education level: Not on file  Occupational History   Not on file  Tobacco Use   Smoking status: Never   Smokeless tobacco: Never  Vaping Use   Vaping Use: Never used  Substance and Sexual Activity   Alcohol use: Yes    Comment: Occasionally every few months    Drug use: No   Sexual activity: Yes    Birth control/protection: Surgical  Other Topics Concern   Not on file  Social History Narrative   Not on file   Social Determinants of Health   Financial Resource Strain: Not on file  Food Insecurity: Not on file  Transportation Needs: Not on file  Physical Activity: Not on file  Stress: Not on file  Social Connections: Not on file  Intimate Partner Violence: Not on file    Current Outpatient Medications on File Prior to Visit  Medication Sig Dispense Refill   ergocalciferol (DRISDOL) 1.25 MG (50000 UT) capsule Take 1 capsule (50,000 Units total) by mouth once a week. 4 capsule 5   phentermine (ADIPEX-P) 37.5 MG tablet Take by mouth.  No current facility-administered medications on file prior to visit.    Allergies  Allergen Reactions   Nsaids Other (See Comments)       Observations/Objective: Today's Vitals   12/22/20 1149  PainSc: 0-No pain   There is no height or weight on file to calculate BMI.  Physical Exam Neurological:     Mental Status: She is alert.    CBC    Component Value Date/Time   WBC 5.8 10/02/2020 1544   RBC 3.35 (L) 10/02/2020 1544   HGB 9.7 (L) 10/02/2020 1544   HGB 7.1 (L)  04/04/2020 0734   HCT 31.0 (L) 10/02/2020 1544   HCT 24.7 (L) 04/04/2020 0734   PLT 267 10/02/2020 1544   PLT 533 (H) 04/04/2020 0734   MCV 92.5 10/02/2020 1544   MCV 78 (L) 04/04/2020 0734   MCV 88 11/24/2013 0458   MCH 29.0 10/02/2020 1544   MCHC 31.3 10/02/2020 1544   RDW 12.3 10/02/2020 1544   RDW 17.9 (H) 04/04/2020 0734   RDW 13.3 11/24/2013 0458   LYMPHSABS 1.9 10/02/2020 1544   LYMPHSABS 0.7 (L) 11/24/2013 0458   MONOABS 0.3 10/02/2020 1544   MONOABS 0.4 11/24/2013 0458   EOSABS 0.2 10/02/2020 1544   EOSABS 0.0 11/24/2013 0458   BASOSABS 0.0 10/02/2020 1544   BASOSABS 0.0 11/24/2013 0458    CMP     Component Value Date/Time   NA 136 04/04/2020 0734   NA 133 (L) 11/24/2013 0458   K 4.3 04/04/2020 0734   K 3.8 11/24/2013 0458   CL 102 04/04/2020 0734   CL 104 11/24/2013 0458   CO2 22 04/04/2020 0734   CO2 22 11/24/2013 0458   GLUCOSE 86 04/04/2020 0734   GLUCOSE 131 (H) 02/05/2019 1926   GLUCOSE 98 11/24/2013 0458   BUN 11 04/04/2020 0734   BUN 6 (L) 11/24/2013 0458   CREATININE 0.71 04/04/2020 0734   CREATININE 0.69 11/24/2013 0458   CALCIUM 9.3 04/04/2020 0734   CALCIUM 8.7 11/24/2013 0458   PROT 7.2 04/04/2020 0734   PROT 7.7 08/10/2013 1440   ALBUMIN 4.3 04/04/2020 0734   ALBUMIN 3.5 11/24/2013 0458   AST 14 04/04/2020 0734   AST 22 08/10/2013 1440   ALT 5 04/04/2020 0734   ALT 11 (L) 08/10/2013 1440   ALKPHOS 53 04/04/2020 0734   ALKPHOS 51 08/10/2013 1440   BILITOT <0.2 04/04/2020 0734   BILITOT 0.2 08/10/2013 1440   GFRNONAA 108 04/04/2020 0734   GFRNONAA >60 11/24/2013 0458   GFRAA 124 04/04/2020 0734   GFRAA >60 11/24/2013 0458     Assessment and Plan: 1. Iron deficiency anemia due to chronic blood loss   2. Menorrhagia with regular cycle     Patient had blood work done at her primary care provider's office at a [place] clinic.  Labs reviewed with patient. Hemoglobin has improved to 11.5, with improvement of ferritin to 60.  TIBC and  iron saturation were not obtained. Discussed with patient about option of continue with additional IV Venofer treatments versus switch to oral iron supplementation.  Patient opted to switch to oral iron supplementation. Niferex prescription was sent to pharmacy.  Follow Up Instructions: 4 months, lab, CBC iron TIBC ferritin prior to virtual visit.   I discussed the assessment and treatment plan with the patient. The patient was provided an opportunity to ask questions and all were answered. The patient agreed with the plan and demonstrated an understanding of the instructions.  The patient was advised  to call back or seek an in-person evaluation if the symptoms worsen or if the condition fails to improve as anticipated.   Earlie Server, MD 12/22/2020 10:48 PM

## 2020-12-26 ENCOUNTER — Inpatient Hospital Stay: Payer: Medicaid Other

## 2021-04-16 ENCOUNTER — Encounter: Payer: Medicaid Other | Admitting: Nurse Practitioner

## 2021-04-23 NOTE — H&P (Signed)
Angie Lamb is a 40 y.o. female here for Menstrual Problem .pt with a long h/o heavy menstrual flow   Dr Leonides Schanz documented fibroids :09/2020: LMP=09/12/2020   Uterus anteverted   Endometrium=11.4m   Left ovary appears wnl Rt ovarian cyst=3cm No free fluid seen   Fibroids seen:1)Rt lateral=6.4cm     2)fundal=8.2cm     3)posterior=4.3cm   She then placed her on IUD , but bleeding has continued with heavy 3-4 days and then light for another 7-10 days  No dyspareunia  G5P4 svd x 3 c/s x 1 Pap- neg   EMBX - neg   Past Medical History:  has a past medical history of Abnormal Pap smear of cervix (2016), Anemia, Chicken pox (1985), Fibroid uterus, History of bariatric surgery, History of iron deficiency anemia, and Vitamin D deficiency.  Past Surgical History:  has a past surgical history that includes Laparoscopic gastric bypass (N/A, 2015); Tooth extraction (2009, 2016); Dilation and curettage of uterus (2000); Tubal ligation; and Cesarean section. Family History: family history includes Diabetes in her paternal grandmother; Myocardial Infarction (Heart attack) in her paternal grandfather; No Known Problems in her brother, brother, and sister; Thyroid disease in her father, mother, paternal aunt, and paternal uncle. Social History:  reports that she has never smoked. She has never used smokeless tobacco. She reports current alcohol use. She reports previous drug use. OB/GYN History:          OB History     Gravida  5   Para  4   Term  3   Preterm  1   AB  1   Living  4      SAB      IAB  1   Ectopic      Molar      Multiple      Live Births  4             Allergies: is allergic to nsaids (non-steroidal anti-inflammatory drug). Medications:   Current Outpatient Medications:    cholecalciferol (CHOLECALCIFEROL) 1,000 unit tablet, Take 1 tablet (1,000 Units total) by mouth once daily PLEASE SUPPLY WHAT IS COVERED BY MEDICAID, Disp: 30 tablet, Rfl: 5   phentermine  (ADIPEX-P) 37.5 mg tablet, Take 1 tablet (37.5 mg total) by mouth every morning before breakfast for 30 days, Disp: 30 tablet, Rfl: 0   ACCU-CHEK GUIDE ME GLUCOSE MTR Misc, USE TO TEST BLOOD SUGAR ONCE DAILY (Patient not taking: No sig reported), Disp: , Rfl:    blood glucose diagnostic test strip, Use 4 (four) times daily (Patient not taking: No sig reported), Disp: 100 each, Rfl: 3   blood glucose meter kit, 4 (four) times daily (Patient not taking: No sig reported), Disp: , Rfl:    blood pressure test kit-large Kit, Use (Patient not taking: No sig reported), Disp: , Rfl:    DEXTROSE ORAL, Take by mouth (Patient not taking: No sig reported), Disp: , Rfl:    gabapentin (NEURONTIN) 300 MG capsule, TAKE 1 CAPSULE BY MOUTH AT BEDTIME FOR 10 DAYS (Patient not taking: No sig reported), Disp: , Rfl:    ibuprofen (MOTRIN) 800 MG tablet, TAKE 1 TABLET BY MOUTH THREE TIMES DAILY AS NEEDED FOR FEVER OR MODERATE PAIN (Patient not taking: No sig reported), Disp: , Rfl:    lancets, 4 (four) times daily (Patient not taking: No sig reported), Disp: , Rfl:    levonorgestreL (LILETTA) 20.1 mcg/24 hrs (6 yrs) 52 mg IUD, Insert 1 each into the uterus  once Placed 09/2020, Disp: , Rfl:    metroNIDAZOLE (FLAGYL) 500 MG tablet, Take 1 tablet (500 mg total) by mouth 2 (two) times daily for 7 days, Disp: 14 tablet, Rfl: 0   multivitamin Chew, Take 1 tablet by mouth once daily    (Patient not taking: No sig reported), Disp: , Rfl:    niFEdipine (PROCARDIA-XL) 30 MG (OSM) XL tablet, Take 1 tablet (30 mg total) by mouth once daily (Patient not taking: Reported on 03/17/2019  ), Disp: 30 tablet, Rfl: 3   norgestimate-ethinyl estradioL (SPRINTEC 0.25/35, 28,) 0.25-35 mg-mcg tablet, Take 1 tablet by mouth once daily (Patient not taking: No sig reported), Disp: 30 tablet, Rfl: 0   ondansetron (ZOFRAN) 4 MG tablet, TAKE 1 TABLET BY MOUTH EVERY 8 HOURS AS NEEDED FOR NAUSEA AND VOMITING (Patient not taking: No sig reported), Disp: , Rfl:     oxyCODONE-acetaminophen (PERCOCET) 5-325 mg tablet, TK 1 T PO Q 4 H PRF SEVERE PAIN (Patient not taking: No sig reported), Disp: , Rfl:    predniSONE (DELTASONE) 10 MG tablet, 6-day taper; 6 pills day one, 5 pills day 2, 4 pills day 3, etc. Follow package instructions. (Patient not taking: Reported on 01/29/2021), Disp: 21 tablet, Rfl: 0   prenatal vitamin-iron-FA (PRENATE PLUS) tablet, Take by mouth (Patient not taking: No sig reported), Disp: , Rfl:    Review of Systems: General:                      No fatigue or weight loss Eyes:                           No vision changes Ears:                            No hearing difficulty Respiratory:                No cough or shortness of breath Pulmonary:                  No asthma or shortness of breath Cardiovascular:           No chest pain, palpitations, dyspnea on exertion Gastrointestinal:          No abdominal bloating, chronic diarrhea, constipations, masses, pain or hematochezia Genitourinary:             No hematuria, dysuria, abnormal vaginal discharge, pelvic pain, +Menometrorrhagia Lymphatic:                   No swollen lymph nodes Musculoskeletal:         No muscle weakness Neurologic:                  No extremity weakness, syncope, seizure disorder Psychiatric:                  No history of depression, delusions or suicidal/homicidal ideation      Exam:       Vitals:    04/24/21  BP: 120/83  Pulse: 99      Body mass index is 32.12 kg/m.   WDWN white/ female in NAD   Lungs: CTA  CV : RRR without murmur   Neck:  no thyromegaly Abdomen: soft , no mass, normal active bowel sounds,  non-tender, no rebound tenderness Pelvic: tanner stage 5 ,  External genitalia: vulva /labia no lesions  Urethra: no prolapse Vagina: thin d/c with bubbles : wet mount : +clue cells Cervix: IUD strings seen  no lesions, no cervical motion tenderness   Uterus:14 weeks irregular size shape and contour, non-tender Adnexa: no mass,   non-tender   Rectovaginal:    Impression:    The primary encounter diagnosis was Cervical cancer screening. Diagnoses of Menorrhagia with irregular cycle, Uterine leiomyoma, unspecified location, Bacterial vaginosis, and Leukorrhea were also pertinent to this visit.       Plan:    Spoke to her about options including continuing on the IUD , endometrial ablation ( less likely to be effectiv e with large fibroids , vs hysterectomy . [facility] )

## 2021-04-24 ENCOUNTER — Telehealth: Payer: Self-pay | Admitting: Oncology

## 2021-04-24 ENCOUNTER — Inpatient Hospital Stay: Payer: Medicaid Other | Attending: Oncology

## 2021-04-24 ENCOUNTER — Other Ambulatory Visit: Payer: Self-pay

## 2021-04-24 DIAGNOSIS — G4730 Sleep apnea, unspecified: Secondary | ICD-10-CM | POA: Diagnosis not present

## 2021-04-24 DIAGNOSIS — D500 Iron deficiency anemia secondary to blood loss (chronic): Secondary | ICD-10-CM

## 2021-04-24 DIAGNOSIS — Z79899 Other long term (current) drug therapy: Secondary | ICD-10-CM | POA: Diagnosis not present

## 2021-04-24 DIAGNOSIS — I10 Essential (primary) hypertension: Secondary | ICD-10-CM | POA: Insufficient documentation

## 2021-04-24 DIAGNOSIS — D509 Iron deficiency anemia, unspecified: Secondary | ICD-10-CM | POA: Diagnosis present

## 2021-04-24 DIAGNOSIS — N920 Excessive and frequent menstruation with regular cycle: Secondary | ICD-10-CM | POA: Diagnosis not present

## 2021-04-24 LAB — IRON AND TIBC
Iron: 23 ug/dL — ABNORMAL LOW (ref 28–170)
Saturation Ratios: 6 % — ABNORMAL LOW (ref 10.4–31.8)
TIBC: 407 ug/dL (ref 250–450)
UIBC: 384 ug/dL

## 2021-04-24 LAB — CBC WITH DIFFERENTIAL/PLATELET
Abs Immature Granulocytes: 0.01 10*3/uL (ref 0.00–0.07)
Basophils Absolute: 0 10*3/uL (ref 0.0–0.1)
Basophils Relative: 1 %
Eosinophils Absolute: 0.2 10*3/uL (ref 0.0–0.5)
Eosinophils Relative: 3 %
HCT: 31.5 % — ABNORMAL LOW (ref 36.0–46.0)
Hemoglobin: 10 g/dL — ABNORMAL LOW (ref 12.0–15.0)
Immature Granulocytes: 0 %
Lymphocytes Relative: 26 %
Lymphs Abs: 1.7 10*3/uL (ref 0.7–4.0)
MCH: 30 pg (ref 26.0–34.0)
MCHC: 31.7 g/dL (ref 30.0–36.0)
MCV: 94.6 fL (ref 80.0–100.0)
Monocytes Absolute: 0.3 10*3/uL (ref 0.1–1.0)
Monocytes Relative: 5 %
Neutro Abs: 4.3 10*3/uL (ref 1.7–7.7)
Neutrophils Relative %: 65 %
Platelets: 345 10*3/uL (ref 150–400)
RBC: 3.33 MIL/uL — ABNORMAL LOW (ref 3.87–5.11)
RDW: 12.1 % (ref 11.5–15.5)
WBC: 6.5 10*3/uL (ref 4.0–10.5)
nRBC: 0 % (ref 0.0–0.2)

## 2021-04-24 LAB — FERRITIN: Ferritin: 8 ng/mL — ABNORMAL LOW (ref 11–307)

## 2021-04-24 NOTE — Telephone Encounter (Signed)
04/24/2021 Left VM informing pt that virtual appt on 10/21 has been moved to 10/26 @ 11:45 due to the provider's schedule. Gave call back number in case there are any scheduling questions SRW

## 2021-04-24 NOTE — H&P (Signed)
Angie Lamb is a 40 y.o. female here for Menstrual Problem .pt with a long h/o heavy menstrual flow   Dr Leonides Schanz documented fibroids :09/2020: LMP=09/12/2020   Uterus anteverted   Endometrium=11.73m   Left ovary appears wnl Rt ovarian cyst=3cm No free fluid seen   Fibroids seen:1)Rt lateral=6.4cm     2)fundal=8.2cm     3)posterior=4.3cm   She then placed her on IUD , but bleeding has continued with heavy 3-4 days and then light for another 7-10 days  No dyspareunia  G5P4 svd x 3 c/s x 1 Pap- neg   EMBX - neg    Past Medical History:  has a past medical history of Abnormal Pap smear of cervix (2016), Anemia, Chicken pox (1985), Fibroid uterus, History of bariatric surgery, History of iron deficiency anemia, and Vitamin D deficiency.  Past Surgical History:  has a past surgical history that includes Laparoscopic gastric bypass (N/A, 2015); Tooth extraction (2009, 2016); Dilation and curettage of uterus (2000); Tubal ligation; and Cesarean section. Family History: family history includes Diabetes in her paternal grandmother; Myocardial Infarction (Heart attack) in her paternal grandfather; No Known Problems in her brother, brother, and sister; Thyroid disease in her father, mother, paternal aunt, and paternal uncle. Social History:  reports that she has never smoked. She has never used smokeless tobacco. She reports current alcohol use. She reports previous drug use. OB/GYN History:                 OB History     Gravida  5   Para  4   Term  3   Preterm  1   AB  1   Living  4      SAB      IAB  1   Ectopic      Molar      Multiple      Live Births  4             Allergies: is allergic to nsaids (non-steroidal anti-inflammatory drug). Medications:   Current Outpatient Medications:    cholecalciferol (CHOLECALCIFEROL) 1,000 unit tablet, Take 1 tablet (1,000 Units total) by mouth once daily PLEASE SUPPLY WHAT IS COVERED BY MEDICAID, Disp: 30 tablet, Rfl: 5    phentermine (ADIPEX-P) 37.5 mg tablet, Take 1 tablet (37.5 mg total) by mouth every morning before breakfast for 30 days, Disp: 30 tablet, Rfl: 0   ACCU-CHEK GUIDE ME GLUCOSE MTR Misc, USE TO TEST BLOOD SUGAR ONCE DAILY (Patient not taking: No sig reported), Disp: , Rfl:    blood glucose diagnostic test strip, Use 4 (four) times daily (Patient not taking: No sig reported), Disp: 100 each, Rfl: 3   blood glucose meter kit, 4 (four) times daily (Patient not taking: No sig reported), Disp: , Rfl:    blood pressure test kit-large Kit, Use (Patient not taking: No sig reported), Disp: , Rfl:    DEXTROSE ORAL, Take by mouth (Patient not taking: No sig reported), Disp: , Rfl:    gabapentin (NEURONTIN) 300 MG capsule, TAKE 1 CAPSULE BY MOUTH AT BEDTIME FOR 10 DAYS (Patient not taking: No sig reported), Disp: , Rfl:    ibuprofen (MOTRIN) 800 MG tablet, TAKE 1 TABLET BY MOUTH THREE TIMES DAILY AS NEEDED FOR FEVER OR MODERATE PAIN (Patient not taking: No sig reported), Disp: , Rfl:    lancets, 4 (four) times daily (Patient not taking: No sig reported), Disp: , Rfl:    levonorgestreL (LILETTA) 20.1 mcg/24 hrs (6 yrs) 52  mg IUD, Insert 1 each into the uterus once Placed 09/2020, Disp: , Rfl:    metroNIDAZOLE (FLAGYL) 500 MG tablet, Take 1 tablet (500 mg total) by mouth 2 (two) times daily for 7 days, Disp: 14 tablet, Rfl: 0   multivitamin Chew, Take 1 tablet by mouth once daily    (Patient not taking: No sig reported), Disp: , Rfl:    niFEdipine (PROCARDIA-XL) 30 MG (OSM) XL tablet, Take 1 tablet (30 mg total) by mouth once daily (Patient not taking: Reported on 03/17/2019  ), Disp: 30 tablet, Rfl: 3   norgestimate-ethinyl estradioL (SPRINTEC 0.25/35, 28,) 0.25-35 mg-mcg tablet, Take 1 tablet by mouth once daily (Patient not taking: No sig reported), Disp: 30 tablet, Rfl: 0   ondansetron (ZOFRAN) 4 MG tablet, TAKE 1 TABLET BY MOUTH EVERY 8 HOURS AS NEEDED FOR NAUSEA AND VOMITING (Patient not taking: No sig reported),  Disp: , Rfl:    oxyCODONE-acetaminophen (PERCOCET) 5-325 mg tablet, TK 1 T PO Q 4 H PRF SEVERE PAIN (Patient not taking: No sig reported), Disp: , Rfl:    predniSONE (DELTASONE) 10 MG tablet, 6-day taper; 6 pills day one, 5 pills day 2, 4 pills day 3, etc. Follow package instructions. (Patient not taking: Reported on 01/29/2021), Disp: 21 tablet, Rfl: 0   prenatal vitamin-iron-FA (PRENATE PLUS) tablet, Take by mouth (Patient not taking: No sig reported), Disp: , Rfl:    Review of Systems: General:                      No fatigue or weight loss Eyes:                           No vision changes Ears:                            No hearing difficulty Respiratory:                No cough or shortness of breath Pulmonary:                  No asthma or shortness of breath Cardiovascular:           No chest pain, palpitations, dyspnea on exertion Gastrointestinal:          No abdominal bloating, chronic diarrhea, constipations, masses, pain or hematochezia Genitourinary:             No hematuria, dysuria, abnormal vaginal discharge, pelvic pain, +Menometrorrhagia Lymphatic:                   No swollen lymph nodes Musculoskeletal:         No muscle weakness Neurologic:                  No extremity weakness, syncope, seizure disorder Psychiatric:                  No history of depression, delusions or suicidal/homicidal ideation      Exam:         Vitals:    04/24/21  BP: 120/83  Pulse: 99      Body mass index is 32.12 kg/m.   WDWN white/ female in NAD   Lungs: CTA  CV : RRR without murmur   Neck:  no thyromegaly Abdomen: soft , no mass, normal active bowel sounds,  non-tender, no rebound tenderness Pelvic: tanner  stage 5 ,  External genitalia: vulva /labia no lesions Urethra: no prolapse Vagina: thin d/c with bubbles : wet mount : +clue cells Cervix: IUD strings seen  no lesions, no cervical motion tenderness   Uterus:14 weeks irregular size shape and contour, globular,  non-tender Adnexa: no mass,  non-tender   Rectovaginal:     Impression:    The primary encounter diagnosis was Cervical cancer screening. Diagnoses of Menorrhagia with irregular cycle, Uterine leiomyoma, unspecified location,       Plan:    Pt surgery converted to planned [facility] to TAH / BS given [facility] does not support LSh with morcellation .  Full counseling done with risks discussed . See [facility] notes

## 2021-04-27 ENCOUNTER — Inpatient Hospital Stay: Payer: Medicaid Other | Admitting: Oncology

## 2021-05-02 ENCOUNTER — Telehealth: Payer: Medicaid Other | Admitting: Oncology

## 2021-05-02 ENCOUNTER — Inpatient Hospital Stay (HOSPITAL_BASED_OUTPATIENT_CLINIC_OR_DEPARTMENT_OTHER): Payer: Medicaid Other | Admitting: Oncology

## 2021-05-02 ENCOUNTER — Other Ambulatory Visit: Payer: Self-pay

## 2021-05-02 ENCOUNTER — Encounter: Payer: Self-pay | Admitting: Oncology

## 2021-05-02 DIAGNOSIS — N920 Excessive and frequent menstruation with regular cycle: Secondary | ICD-10-CM | POA: Diagnosis not present

## 2021-05-02 DIAGNOSIS — D500 Iron deficiency anemia secondary to blood loss (chronic): Secondary | ICD-10-CM

## 2021-05-02 NOTE — Progress Notes (Signed)
Pt confirmed availability for virtual visit as scheduled. No concerns/complaints at this time.

## 2021-05-02 NOTE — Progress Notes (Signed)
HEMATOLOGY-ONCOLOGY TeleHEALTH VISIT PROGRESS NOTE  I connected with Angie Lamb on 05/02/21  at  2:45 PM EDT by video enabled telemedicine visit and verified that I am speaking with the correct person using two identifiers. I discussed the limitations, risks, security and privacy concerns of performing an evaluation and management service by telemedicine and the availability of in-person appointments. The patient expressed understanding and agreed to proceed.   Other persons participating in the visit and their role in the encounter:  None  Patient's location: Home  Provider's location: office Chief Complaint: follow up for [place] is a 40 y.o. female who has above history reviewed by me today presents for follow up visit for management of IDA Patient reports not currently taking oral iron supplementation.  Patient has seen gynecology with plans to have hysterectomy in 2 weeks. Chronic fatigue.  Review of Systems  Constitutional:  Positive for fatigue. Negative for appetite change, chills and fever.  HENT:   Negative for hearing loss and voice change.   Eyes:  Negative for eye problems.  Respiratory:  Negative for chest tightness and cough.   Cardiovascular:  Negative for chest pain.  Gastrointestinal:  Negative for abdominal distention, abdominal pain and blood in stool.  Endocrine: Negative for hot flashes.  Genitourinary:  Negative for difficulty urinating and frequency.   Musculoskeletal:  Negative for arthralgias.  Skin:  Negative for itching and rash.  Neurological:  Negative for extremity weakness.  Hematological:  Negative for adenopathy.  Psychiatric/Behavioral:  Negative for confusion.    Past Medical History:  Diagnosis Date   Gestational diabetes    Hypertension    Reflux    Sleep apnea, obstructive    Past Surgical History:  Procedure Laterality Date   CESAREAN SECTION WITH BILATERAL TUBAL LIGATION N/A 02/04/2019   Procedure:  CESAREAN SECTION WITH BILATERAL TUBAL LIGATION;  Surgeon: Benjaman Kindler, MD;  Location: [facility] ORS;  Service: Obstetrics;  Laterality: N/A;   LAPAROSCOPIC GASTRIC SLEEVE RESECTION      Family History  Problem Relation Age of Onset   Diabetes Paternal Grandmother    Thyroid disease Mother    Thyroid disease Father     Social History   Socioeconomic History   Marital status: Single    Spouse name: Not on file   Number of children: Not on file   Years of education: Not on file   Highest education level: Not on file  Occupational History   Not on file  Tobacco Use   Smoking status: Never   Smokeless tobacco: Never  Vaping Use   Vaping Use: Never used  Substance and Sexual Activity   Alcohol use: Yes    Comment: Occasionally every few months    Drug use: No   Sexual activity: Yes    Birth control/protection: Surgical  Other Topics Concern   Not on file  Social History Narrative   Not on file   Social Determinants of Health   Financial Resource Strain: Not on file  Food Insecurity: Not on file  Transportation Needs: Not on file  Physical Activity: Not on file  Stress: Not on file  Social Connections: Not on file  Intimate Partner Violence: Not on file    Current Outpatient Medications on File Prior to Visit  Medication Sig Dispense Refill   acetaminophen (TYLENOL) 500 MG tablet Take 500 mg by mouth every 6 (six) hours as needed for mild pain.     phentermine (ADIPEX-P) 37.5 MG  tablet Take 37.5 mg by mouth daily before breakfast.     No current facility-administered medications on file prior to visit.    Allergies  Allergen Reactions   Nsaids Other (See Comments)    Avoids due to history of bariatric surgery       Observations/Objective: Today's Vitals   05/02/21 1312  PainSc: 0-No pain   There is no height or weight on file to calculate BMI.  Physical Exam Neurological:     Mental Status: She is alert.    CBC    Component Value Date/Time   WBC 6.5  04/24/2021 1541   RBC 3.33 (L) 04/24/2021 1541   HGB 10.0 (L) 04/24/2021 1541   HGB 7.1 (L) 04/04/2020 0734   HCT 31.5 (L) 04/24/2021 1541   HCT 24.7 (L) 04/04/2020 0734   PLT 345 04/24/2021 1541   PLT 533 (H) 04/04/2020 0734   MCV 94.6 04/24/2021 1541   MCV 78 (L) 04/04/2020 0734   MCV 88 11/24/2013 0458   MCH 30.0 04/24/2021 1541   MCHC 31.7 04/24/2021 1541   RDW 12.1 04/24/2021 1541   RDW 17.9 (H) 04/04/2020 0734   RDW 13.3 11/24/2013 0458   LYMPHSABS 1.7 04/24/2021 1541   LYMPHSABS 0.7 (L) 11/24/2013 0458   MONOABS 0.3 04/24/2021 1541   MONOABS 0.4 11/24/2013 0458   EOSABS 0.2 04/24/2021 1541   EOSABS 0.0 11/24/2013 0458   BASOSABS 0.0 04/24/2021 1541   BASOSABS 0.0 11/24/2013 0458    CMP     Component Value Date/Time   NA 136 04/04/2020 0734   NA 133 (L) 11/24/2013 0458   K 4.3 04/04/2020 0734   K 3.8 11/24/2013 0458   CL 102 04/04/2020 0734   CL 104 11/24/2013 0458   CO2 22 04/04/2020 0734   CO2 22 11/24/2013 0458   GLUCOSE 86 04/04/2020 0734   GLUCOSE 131 (H) 02/05/2019 1926   GLUCOSE 98 11/24/2013 0458   BUN 11 04/04/2020 0734   BUN 6 (L) 11/24/2013 0458   CREATININE 0.71 04/04/2020 0734   CREATININE 0.69 11/24/2013 0458   CALCIUM 9.3 04/04/2020 0734   CALCIUM 8.7 11/24/2013 0458   PROT 7.2 04/04/2020 0734   PROT 7.7 08/10/2013 1440   ALBUMIN 4.3 04/04/2020 0734   ALBUMIN 3.5 11/24/2013 0458   AST 14 04/04/2020 0734   AST 22 08/10/2013 1440   ALT 5 04/04/2020 0734   ALT 11 (L) 08/10/2013 1440   ALKPHOS 53 04/04/2020 0734   ALKPHOS 51 08/10/2013 1440   BILITOT <0.2 04/04/2020 0734   BILITOT 0.2 08/10/2013 1440   GFRNONAA 108 04/04/2020 0734   GFRNONAA >60 11/24/2013 0458   GFRAA 124 04/04/2020 0734   GFRAA >60 11/24/2013 0458     Assessment and Plan: 1. Iron deficiency anemia due to chronic blood loss   2. Menorrhagia with regular cycle     #Iron deficiency anemia due to menorrhagia. Labs reviewed and discussed with patient.  Hemoglobin is 10  today.  Iron panel shows ferritin of 8, iron saturation of 6.  Consistent with severe iron deficiency anemia.  Recommend patient to received IV Venofer 200 mg x 3.   Hopefully to further increase her hemoglobin level prior to her procedure. Post surgery, recommend patient to take oral iron supplementation for 1 to 2 months. I recommend patient to repeat blood work and have virtual visit with me in 6 months to ensure the stability of her blood level.  Anticipated iron deficiency will be resolved at that point.  I discussed the assessment and treatment plan with the patient. The patient was provided an opportunity to ask questions and all were answered. The patient agreed with the plan and demonstrated an understanding of the instructions.  The patient was advised to call back or seek an in-person evaluation if the symptoms worsen or if the condition fails to improve as anticipated.   Earlie Server, MD 05/02/2021 8:44 PM

## 2021-05-03 ENCOUNTER — Encounter
Admission: RE | Admit: 2021-05-03 | Discharge: 2021-05-03 | Disposition: A | Discharge status: Home / Self Care | Payer: Medicaid Other | Source: Ambulatory Visit | Attending: Obstetrics and Gynecology | Admitting: Obstetrics and Gynecology

## 2021-05-03 HISTORY — DX: Iron deficiency anemia, unspecified: D50.9

## 2021-05-03 HISTORY — DX: Vitamin D deficiency, unspecified: E55.9

## 2021-05-03 HISTORY — DX: Anemia, unspecified: D64.9

## 2021-05-03 NOTE — Patient Instructions (Addendum)
Your procedure is scheduled on: Monday, November 7 Report to the Registration Desk on the 1st floor of the [facility]. To find out your arrival time, please call [phone] between 1PM - 3PM on: Friday, November 4  REMEMBER: Instructions that are not followed completely may result in serious medical risk, up to and including death; or upon the discretion of your surgeon and anesthesiologist your surgery may need to be rescheduled.  Do not eat food after midnight the night before surgery.  No gum chewing, lozengers or hard candies.  You may however, drink CLEAR liquids up to 2 hours before you are scheduled to arrive for your surgery. Do not drink anything within 2 hours of your scheduled arrival time.  Clear liquids include: - water  - apple juice without pulp - gatorade (not RED, PURPLE, OR BLUE) - black coffee or tea (Do NOT add milk or creamers to the coffee or tea) Do NOT drink anything that is not on this list.  In addition, your doctor has ordered for you to drink the provided  Ensure Pre-Surgery Clear Carbohydrate Drink  Drinking this carbohydrate drink up to two hours before surgery helps to reduce insulin resistance and improve patient outcomes. Please complete drinking 2 hours prior to scheduled arrival time.  DO NOT TAKE ANY MEDICATIONS THE MORNING OF SURGERY  One week prior to surgery: starting October 31 Stop Anti-inflammatories (NSAIDS) such as Advil, Aleve, Ibuprofen, Motrin, Naproxen, Naprosyn and Aspirin based products such as Excedrin, Goodys Powder, BC Powder. Stop ANY OVER THE COUNTER supplements until after surgery. You may however, continue to take Tylenol if needed for pain up until the day of surgery.  No Alcohol for 24 hours before or after surgery.  No Smoking including e-cigarettes for 24 hours prior to surgery.  No chewable tobacco products for at least 6 hours prior to surgery.  No nicotine patches on the day of surgery.  Do not use any  "recreational" drugs for at least a week prior to your surgery.  Please be advised that the combination of cocaine and anesthesia may have negative outcomes, up to and including death. If you test positive for cocaine, your surgery will be cancelled.  On the morning of surgery brush your teeth with toothpaste and water, you may rinse your mouth with mouthwash if you wish. Do not swallow any toothpaste or mouthwash.  Use CHG Soap as directed on instruction sheet.  Do not wear jewelry, make-up, hairpins, clips or nail polish.  Do not wear lotions, powders, or perfumes.   Do not shave body from the neck down 48 hours prior to surgery just in case you cut yourself which could leave a site for infection.  Also, freshly shaved skin may become irritated if using the CHG soap.  Contact lenses, hearing aids and dentures may not be worn into surgery.  Do not bring valuables to the hospital. [facility] is not responsible for any missing/lost belongings or valuables.   Notify your doctor if there is any change in your medical condition (cold, fever, infection).  Wear comfortable clothing (specific to your surgery type) to the hospital.  After surgery, you can help prevent lung complications by doing breathing exercises.  Take deep breaths and cough every 1-2 hours. Your doctor may order a device called an Incentive Spirometer to help you take deep breaths. When coughing or sneezing, hold a pillow firmly against your incision with both hands. This is called "splinting." Doing this helps protect your incision. It  also decreases belly discomfort.  If you are being admitted to the hospital overnight, leave your suitcase in the car. After surgery it may be brought to your room.  If you are being discharged the day of surgery, you will not be allowed to drive home. You will need a responsible adult (18 years or older) to drive you home and stay with you that night.   If you are taking public  transportation, you will need to have a responsible adult (18 years or older) with you. Please confirm with your physician that it is acceptable to use public transportation.   Please call the [facility] at [phone] if you have any questions about these instructions.  Surgery Visitation Policy:  Patients undergoing a surgery or procedure may have one family member or support person with them as long as that person is not COVID-19 positive or experiencing its symptoms.  That person may remain in the waiting area during the procedure and may rotate out with other people.  Inpatient Visitation:    Visiting hours are 7 a.m. to 8 p.m. Up to two visitors ages 16+ are allowed at one time in a patient room. The visitors may rotate out with other people during the day. Visitors must check out when they leave, or other visitors will not be allowed. One designated support person may remain overnight. The visitor must pass COVID-19 screenings, use hand sanitizer when entering and exiting the patient's room and wear a mask at all times, including in the patient's room. Patients must also wear a mask when staff or their visitor are in the room. Masking is required regardless of vaccination status.

## 2021-05-04 ENCOUNTER — Other Ambulatory Visit: Payer: Self-pay

## 2021-05-04 ENCOUNTER — Inpatient Hospital Stay: Payer: Medicaid Other

## 2021-05-04 VITALS — BP 122/93 | HR 87 | Temp 97.6°F

## 2021-05-04 DIAGNOSIS — D508 Other iron deficiency anemias: Secondary | ICD-10-CM

## 2021-05-04 DIAGNOSIS — D509 Iron deficiency anemia, unspecified: Secondary | ICD-10-CM | POA: Diagnosis not present

## 2021-05-04 MED ORDER — SODIUM CHLORIDE 0.9 % IV SOLN: 200.0000 mg | Freq: Once | INTRAVENOUS | Status: DC

## 2021-05-04 MED ORDER — IRON SUCROSE 20 MG/ML IV SOLN
200.0000 mg | Freq: Once | INTRAVENOUS | Status: AC
  Administered 2021-05-04: 200 mg via INTRAVENOUS
  Filled 2021-05-04: qty 10

## 2021-05-04 MED ORDER — SODIUM CHLORIDE 0.9 % IV SOLN
Freq: Once | INTRAVENOUS | Status: AC
  Filled 2021-05-04: qty 250

## 2021-05-08 ENCOUNTER — Inpatient Hospital Stay: Payer: Medicaid Other | Attending: Oncology

## 2021-05-08 ENCOUNTER — Other Ambulatory Visit: Payer: Self-pay

## 2021-05-08 ENCOUNTER — Other Ambulatory Visit: Payer: Medicaid Other

## 2021-05-08 VITALS — BP 123/80 | HR 97 | Temp 96.5°F

## 2021-05-08 DIAGNOSIS — D508 Other iron deficiency anemias: Secondary | ICD-10-CM

## 2021-05-08 DIAGNOSIS — Z79899 Other long term (current) drug therapy: Secondary | ICD-10-CM | POA: Diagnosis not present

## 2021-05-08 DIAGNOSIS — D509 Iron deficiency anemia, unspecified: Secondary | ICD-10-CM | POA: Diagnosis present

## 2021-05-08 MED ORDER — SODIUM CHLORIDE 0.9 % IV SOLN
Freq: Once | INTRAVENOUS | Status: AC
  Filled 2021-05-08: qty 250

## 2021-05-08 MED ORDER — IRON SUCROSE 20 MG/ML IV SOLN
200.0000 mg | Freq: Once | INTRAVENOUS | Status: AC
  Administered 2021-05-08: 200 mg via INTRAVENOUS
  Filled 2021-05-08: qty 10

## 2021-05-08 MED ORDER — SODIUM CHLORIDE 0.9 % IV SOLN: 200.0000 mg | Freq: Once | INTRAVENOUS | Status: DC

## 2021-05-10 ENCOUNTER — Other Ambulatory Visit: Payer: Self-pay

## 2021-05-10 ENCOUNTER — Encounter: Payer: Self-pay | Admitting: Urgent Care

## 2021-05-10 ENCOUNTER — Other Ambulatory Visit: Payer: Self-pay | Admitting: Obstetrics and Gynecology

## 2021-05-10 ENCOUNTER — Inpatient Hospital Stay: Payer: Medicaid Other

## 2021-05-10 ENCOUNTER — Other Ambulatory Visit: Payer: Medicaid Other

## 2021-05-10 ENCOUNTER — Other Ambulatory Visit
Admission: RE | Admit: 2021-05-10 | Discharge: 2021-05-10 | Disposition: A | Discharge status: Home / Self Care | Payer: Medicaid Other | Source: Ambulatory Visit | Attending: Obstetrics and Gynecology | Admitting: Obstetrics and Gynecology

## 2021-05-10 VITALS — BP 128/89 | HR 98 | Temp 96.8°F | Resp 18

## 2021-05-10 DIAGNOSIS — N920 Excessive and frequent menstruation with regular cycle: Secondary | ICD-10-CM | POA: Diagnosis not present

## 2021-05-10 DIAGNOSIS — Z20822 Contact with and (suspected) exposure to covid-19: Secondary | ICD-10-CM | POA: Diagnosis not present

## 2021-05-10 DIAGNOSIS — D509 Iron deficiency anemia, unspecified: Secondary | ICD-10-CM | POA: Diagnosis not present

## 2021-05-10 DIAGNOSIS — Z01812 Encounter for preprocedural laboratory examination: Secondary | ICD-10-CM | POA: Insufficient documentation

## 2021-05-10 DIAGNOSIS — D508 Other iron deficiency anemias: Secondary | ICD-10-CM

## 2021-05-10 LAB — BASIC METABOLIC PANEL
Anion gap: 7 (ref 5–15)
BUN: 14 mg/dL (ref 6–20)
CO2: 27 mmol/L (ref 22–32)
Calcium: 9.1 mg/dL (ref 8.9–10.3)
Chloride: 105 mmol/L (ref 98–111)
Creatinine, Ser: 0.61 mg/dL (ref 0.44–1.00)
GFR, Estimated: >60 mL/min (ref >60)
Glucose, Bld: 78 mg/dL (ref 70–99)
Potassium: 3.7 mmol/L (ref 3.5–5.1)
Sodium: 139 mmol/L (ref 135–145)

## 2021-05-10 LAB — CBC
HCT: 34.3 % — ABNORMAL LOW (ref 36.0–46.0)
Hemoglobin: 10.5 g/dL — ABNORMAL LOW (ref 12.0–15.0)
MCH: 28.8 pg (ref 26.0–34.0)
MCHC: 30.6 g/dL (ref 30.0–36.0)
MCV: 94 fL (ref 80.0–100.0)
Platelets: 319 10*3/uL (ref 150–400)
RBC: 3.65 MIL/uL — ABNORMAL LOW (ref 3.87–5.11)
RDW: 12.3 % (ref 11.5–15.5)
WBC: 5.3 10*3/uL (ref 4.0–10.5)
nRBC: 0 % (ref 0.0–0.2)

## 2021-05-10 LAB — TYPE AND SCREEN
ABO/RH(D): A POS
Antibody Screen: NEGATIVE

## 2021-05-10 MED ORDER — SODIUM CHLORIDE 0.9 % IV SOLN: 200.0000 mg | Freq: Once | INTRAVENOUS | Status: DC

## 2021-05-10 MED ORDER — SODIUM CHLORIDE 0.9 % IV SOLN
Freq: Once | INTRAVENOUS | Status: AC
  Filled 2021-05-10: qty 250

## 2021-05-10 MED ORDER — IRON SUCROSE 20 MG/ML IV SOLN
200.0000 mg | Freq: Once | INTRAVENOUS | Status: AC
  Administered 2021-05-10: 200 mg via INTRAVENOUS
  Filled 2021-05-10: qty 10

## 2021-05-10 NOTE — Patient Instructions (Signed)

## 2021-05-11 LAB — SARS CORONAVIRUS 2 (TAT 6-24 HRS): SARS Coronavirus 2: NEGATIVE

## 2021-05-14 ENCOUNTER — Inpatient Hospital Stay
Admission: RE | Admit: 2021-05-14 | Discharge: 2021-05-15 | DRG: 743 | Disposition: A | Discharge status: Home / Self Care | Payer: Medicaid Other | Attending: Obstetrics and Gynecology | Admitting: Obstetrics and Gynecology

## 2021-05-14 ENCOUNTER — Inpatient Hospital Stay: Payer: Medicaid Other

## 2021-05-14 ENCOUNTER — Encounter: Payer: Self-pay | Admitting: Obstetrics and Gynecology

## 2021-05-14 ENCOUNTER — Encounter
Admission: RE | Disposition: A | Discharge status: Home / Self Care | Payer: Self-pay | Source: Home / Self Care | Attending: Obstetrics and Gynecology

## 2021-05-14 ENCOUNTER — Other Ambulatory Visit: Payer: Self-pay

## 2021-05-14 DIAGNOSIS — D259 Leiomyoma of uterus, unspecified: Principal | ICD-10-CM | POA: Diagnosis present

## 2021-05-14 DIAGNOSIS — Z9889 Other specified postprocedural states: Secondary | ICD-10-CM

## 2021-05-14 DIAGNOSIS — N83201 Unspecified ovarian cyst, right side: Secondary | ICD-10-CM | POA: Diagnosis present

## 2021-05-14 DIAGNOSIS — Z9884 Bariatric surgery status: Secondary | ICD-10-CM | POA: Diagnosis not present

## 2021-05-14 DIAGNOSIS — Z886 Allergy status to analgesic agent status: Secondary | ICD-10-CM | POA: Diagnosis not present

## 2021-05-14 DIAGNOSIS — N920 Excessive and frequent menstruation with regular cycle: Secondary | ICD-10-CM | POA: Diagnosis present

## 2021-05-14 HISTORY — PX: HYSTERECTOMY ABDOMINAL WITH SALPINGECTOMY: SHX6725

## 2021-05-14 LAB — GLUCOSE, CAPILLARY: Glucose-Capillary: 110 mg/dL — ABNORMAL HIGH (ref 70–99)

## 2021-05-14 LAB — POCT PREGNANCY, URINE: Preg Test, Ur: NEGATIVE

## 2021-05-14 SURGERY — HYSTERECTOMY, TOTAL, ABDOMINAL, WITH SALPINGECTOMY
Anesthesia: General | Site: Abdomen | Laterality: Bilateral

## 2021-05-14 MED ORDER — KETOROLAC TROMETHAMINE 30 MG/ML IJ SOLN
INTRAMUSCULAR | Status: AC
  Filled 2021-05-14: qty 1

## 2021-05-14 MED ORDER — FENTANYL CITRATE (PF) 100 MCG/2ML IJ SOLN
INTRAMUSCULAR | Status: AC
  Administered 2021-05-14: 25 ug via INTRAVENOUS
  Filled 2021-05-14: qty 2

## 2021-05-14 MED ORDER — LACTATED RINGERS IV SOLN: INTRAVENOUS | Status: DC | PRN

## 2021-05-14 MED ORDER — LIDOCAINE HCL (PF) 2 % IJ SOLN
INTRAMUSCULAR | Status: AC
  Filled 2021-05-14: qty 5

## 2021-05-14 MED ORDER — PROPOFOL 10 MG/ML IV BOLUS
INTRAVENOUS | Status: AC
  Filled 2021-05-14: qty 20

## 2021-05-14 MED ORDER — SODIUM CHLORIDE (PF) 0.9 % IJ SOLN
INTRAMUSCULAR | Status: DC | PRN
  Administered 2021-05-14: 75 mL

## 2021-05-14 MED ORDER — LIDOCAINE HCL (PF) 2 % IJ SOLN
INTRAMUSCULAR | Status: DC | PRN
  Administered 2021-05-14: 80 mg via INTRADERMAL

## 2021-05-14 MED ORDER — SCOPOLAMINE 1 MG/3DAYS TD PT72
MEDICATED_PATCH | TRANSDERMAL | Status: AC
  Filled 2021-05-14: qty 1

## 2021-05-14 MED ORDER — MORPHINE SULFATE (PF) 4 MG/ML IV SOLN: 1.0000 mg | INTRAVENOUS | Status: DC | PRN

## 2021-05-14 MED ORDER — DEXMEDETOMIDINE (PRECEDEX) IN NS 20 MCG/5ML (4 MCG/ML) IV SYRINGE
PREFILLED_SYRINGE | INTRAVENOUS | Status: AC
  Filled 2021-05-14: qty 5

## 2021-05-14 MED ORDER — SODIUM CHLORIDE (PF) 0.9 % IJ SOLN
INTRAMUSCULAR | Status: AC
  Filled 2021-05-14: qty 10

## 2021-05-14 MED ORDER — OXYCODONE HCL 5 MG PO TABS
5.0000 mg | ORAL_TABLET | ORAL | Status: DC | PRN
  Administered 2021-05-14 – 2021-05-15 (×2): 5 mg via ORAL
  Filled 2021-05-14 (×2): qty 1

## 2021-05-14 MED ORDER — ACETAMINOPHEN 500 MG PO TABS
ORAL_TABLET | ORAL | Status: AC
  Administered 2021-05-14: 1000 mg via ORAL
  Filled 2021-05-14: qty 2

## 2021-05-14 MED ORDER — SCOPOLAMINE 1 MG/3DAYS TD PT72
1.0000 | MEDICATED_PATCH | TRANSDERMAL | Status: DC
  Administered 2021-05-14: 1.5 mg via TRANSDERMAL

## 2021-05-14 MED ORDER — GABAPENTIN 300 MG PO CAPS
300.0000 mg | ORAL_CAPSULE | Freq: Every day | ORAL | Status: DC
  Administered 2021-05-14: 300 mg via ORAL
  Filled 2021-05-14: qty 1

## 2021-05-14 MED ORDER — FAMOTIDINE 20 MG PO TABS
ORAL_TABLET | ORAL | Status: AC
  Administered 2021-05-14: 20 mg via ORAL
  Filled 2021-05-14: qty 1

## 2021-05-14 MED ORDER — GABAPENTIN 300 MG PO CAPS: 300.0000 mg | ORAL_CAPSULE | ORAL | Status: AC

## 2021-05-14 MED ORDER — CHLORHEXIDINE GLUCONATE 0.12 % MT SOLN
OROMUCOSAL | Status: AC
  Administered 2021-05-14: 15 mL via OROMUCOSAL
  Filled 2021-05-14: qty 15

## 2021-05-14 MED ORDER — ROCURONIUM BROMIDE 100 MG/10ML IV SOLN
INTRAVENOUS | Status: DC | PRN
  Administered 2021-05-14: 50 mg via INTRAVENOUS
  Administered 2021-05-14: 20 mg via INTRAVENOUS
  Administered 2021-05-14: 5 mg via INTRAVENOUS

## 2021-05-14 MED ORDER — KETOROLAC TROMETHAMINE 30 MG/ML IJ SOLN
30.0000 mg | Freq: Four times a day (QID) | INTRAMUSCULAR | Status: AC
  Administered 2021-05-14 – 2021-05-15 (×4): 30 mg via INTRAVENOUS
  Filled 2021-05-14 (×4): qty 1

## 2021-05-14 MED ORDER — LACTATED RINGERS IV SOLN: INTRAVENOUS | Status: DC

## 2021-05-14 MED ORDER — STERILE WATER FOR IRRIGATION IR SOLN
Status: DC | PRN
  Administered 2021-05-14: 1000 mL

## 2021-05-14 MED ORDER — ONDANSETRON HCL 4 MG/2ML IJ SOLN
INTRAMUSCULAR | Status: AC
  Filled 2021-05-14: qty 2

## 2021-05-14 MED ORDER — DEXAMETHASONE SODIUM PHOSPHATE 10 MG/ML IJ SOLN
INTRAMUSCULAR | Status: DC | PRN
  Administered 2021-05-14: 8 mg via INTRAVENOUS

## 2021-05-14 MED ORDER — DEXMEDETOMIDINE (PRECEDEX) IN NS 20 MCG/5ML (4 MCG/ML) IV SYRINGE
PREFILLED_SYRINGE | INTRAVENOUS | Status: DC | PRN
  Administered 2021-05-14: 4 ug via INTRAVENOUS
  Administered 2021-05-14 (×3): 8 ug via INTRAVENOUS

## 2021-05-14 MED ORDER — BUPIVACAINE HCL (PF) 0.5 % IJ SOLN
INTRAMUSCULAR | Status: AC
  Filled 2021-05-14: qty 30

## 2021-05-14 MED ORDER — FENTANYL CITRATE (PF) 100 MCG/2ML IJ SOLN
INTRAMUSCULAR | Status: DC | PRN
  Administered 2021-05-14 (×4): 50 ug via INTRAVENOUS

## 2021-05-14 MED ORDER — PROPOFOL 500 MG/50ML IV EMUL
INTRAVENOUS | Status: DC | PRN
  Administered 2021-05-14: 125 ug/kg/min via INTRAVENOUS

## 2021-05-14 MED ORDER — PROMETHAZINE HCL 25 MG/ML IJ SOLN: 12.5000 mg | Freq: Once | INTRAMUSCULAR | Status: DC | PRN

## 2021-05-14 MED ORDER — MORPHINE SULFATE (PF) 2 MG/ML IV SOLN
1.0000 mg | INTRAVENOUS | Status: DC | PRN
  Administered 2021-05-14: 2 mg via INTRAVENOUS
  Filled 2021-05-14: qty 1

## 2021-05-14 MED ORDER — ONDANSETRON HCL 4 MG PO TABS
4.0000 mg | ORAL_TABLET | Freq: Four times a day (QID) | ORAL | Status: DC | PRN
  Administered 2021-05-14: 4 mg via ORAL
  Filled 2021-05-14: qty 1

## 2021-05-14 MED ORDER — ACETAMINOPHEN 500 MG PO TABS
1000.0000 mg | ORAL_TABLET | Freq: Four times a day (QID) | ORAL | Status: DC
  Administered 2021-05-14 – 2021-05-15 (×3): 1000 mg via ORAL
  Filled 2021-05-14 (×4): qty 2

## 2021-05-14 MED ORDER — MIDAZOLAM HCL 2 MG/2ML IJ SOLN
INTRAMUSCULAR | Status: AC
  Filled 2021-05-14: qty 2

## 2021-05-14 MED ORDER — SODIUM CHLORIDE (PF) 0.9 % IJ SOLN
INTRAMUSCULAR | Status: AC
  Filled 2021-05-14: qty 50

## 2021-05-14 MED ORDER — ORAL CARE MOUTH RINSE: 15.0000 mL | Freq: Once | OROMUCOSAL | Status: AC

## 2021-05-14 MED ORDER — ONDANSETRON HCL 4 MG/2ML IJ SOLN
4.0000 mg | Freq: Four times a day (QID) | INTRAMUSCULAR | Status: DC | PRN
  Administered 2021-05-15: 4 mg via INTRAVENOUS
  Filled 2021-05-14: qty 2

## 2021-05-14 MED ORDER — CEFAZOLIN SODIUM-DEXTROSE 2-4 GM/100ML-% IV SOLN
INTRAVENOUS | Status: AC
  Filled 2021-05-14: qty 100

## 2021-05-14 MED ORDER — KETOROLAC TROMETHAMINE 30 MG/ML IJ SOLN
INTRAMUSCULAR | Status: DC | PRN
  Administered 2021-05-14: 30 mg via INTRAVENOUS

## 2021-05-14 MED ORDER — SUGAMMADEX SODIUM 200 MG/2ML IV SOLN
INTRAVENOUS | Status: DC | PRN
  Administered 2021-05-14: 100 mg via INTRAVENOUS
  Administered 2021-05-14: 200 mg via INTRAVENOUS

## 2021-05-14 MED ORDER — FENTANYL CITRATE (PF) 100 MCG/2ML IJ SOLN
25.0000 ug | INTRAMUSCULAR | Status: DC | PRN
  Administered 2021-05-14: 25 ug via INTRAVENOUS
  Administered 2021-05-14: 50 ug via INTRAVENOUS

## 2021-05-14 MED ORDER — IBUPROFEN 600 MG PO TABS
600.0000 mg | ORAL_TABLET | Freq: Four times a day (QID) | ORAL | Status: DC
  Filled 2021-05-14: qty 1

## 2021-05-14 MED ORDER — ONDANSETRON HCL 4 MG/2ML IJ SOLN
INTRAMUSCULAR | Status: DC | PRN
  Administered 2021-05-14: 4 mg via INTRAVENOUS

## 2021-05-14 MED ORDER — CHLORHEXIDINE GLUCONATE 0.12 % MT SOLN: 15.0000 mL | Freq: Once | OROMUCOSAL | Status: AC

## 2021-05-14 MED ORDER — POVIDONE-IODINE 10 % EX SWAB
2.0000 "application " | Freq: Once | CUTANEOUS | Status: AC
  Administered 2021-05-14: 2 via TOPICAL

## 2021-05-14 MED ORDER — SIMETHICONE 80 MG PO CHEW: 80.0000 mg | CHEWABLE_TABLET | Freq: Four times a day (QID) | ORAL | Status: DC | PRN

## 2021-05-14 MED ORDER — ACETAMINOPHEN 500 MG PO TABS: 1000.0000 mg | ORAL_TABLET | ORAL | Status: AC

## 2021-05-14 MED ORDER — PROPOFOL 10 MG/ML IV BOLUS
INTRAVENOUS | Status: DC | PRN
  Administered 2021-05-14: 150 mg via INTRAVENOUS

## 2021-05-14 MED ORDER — FENTANYL CITRATE (PF) 100 MCG/2ML IJ SOLN
INTRAMUSCULAR | Status: AC
  Filled 2021-05-14: qty 2

## 2021-05-14 MED ORDER — CEFAZOLIN SODIUM-DEXTROSE 2-4 GM/100ML-% IV SOLN
2.0000 g | Freq: Once | INTRAVENOUS | Status: AC
  Administered 2021-05-14: 2 g via INTRAVENOUS

## 2021-05-14 MED ORDER — DEXAMETHASONE SODIUM PHOSPHATE 10 MG/ML IJ SOLN
INTRAMUSCULAR | Status: AC
  Filled 2021-05-14: qty 1

## 2021-05-14 MED ORDER — STERILE WATER FOR IRRIGATION IR SOLN
Status: DC | PRN
  Administered 2021-05-14: 500 mL

## 2021-05-14 MED ORDER — GABAPENTIN 300 MG PO CAPS
ORAL_CAPSULE | ORAL | Status: AC
  Administered 2021-05-14: 300 mg via ORAL
  Filled 2021-05-14: qty 1

## 2021-05-14 MED ORDER — ROCURONIUM BROMIDE 10 MG/ML (PF) SYRINGE
PREFILLED_SYRINGE | INTRAVENOUS | Status: AC
  Filled 2021-05-14: qty 10

## 2021-05-14 MED ORDER — FAMOTIDINE 20 MG PO TABS: 20.0000 mg | ORAL_TABLET | Freq: Once | ORAL | Status: AC

## 2021-05-14 MED ORDER — MIDAZOLAM HCL 2 MG/2ML IJ SOLN
INTRAMUSCULAR | Status: DC | PRN
  Administered 2021-05-14: 2 mg via INTRAVENOUS

## 2021-05-14 SURGICAL SUPPLY — 46 items
CHLORAPREP W/TINT 26 (MISCELLANEOUS) ×2 IMPLANT
DRAPE LAP W/FLUID (DRAPES) ×2 IMPLANT
DRAPE UNDER BUTTOCK W/FLU (DRAPES) ×1 IMPLANT
DRSG TELFA 3X8 NADH (GAUZE/BANDAGES/DRESSINGS) ×2 IMPLANT
ELECT BLADE 6.5 EXT (BLADE) IMPLANT
ELECT REM PT RETURN 9FT ADLT (ELECTROSURGICAL) ×2
ELECTRODE REM PT RTRN 9FT ADLT (ELECTROSURGICAL) ×1 IMPLANT
GAUZE 4X4 16PLY ~~LOC~~+RFID DBL (SPONGE) ×4 IMPLANT
GAUZE SPONGE 4X4 12PLY STRL (GAUZE/BANDAGES/DRESSINGS) ×2 IMPLANT
GLOVE SURG ENC MOIS LTX SZ7 (GLOVE) ×2 IMPLANT
GLOVE SURG SYN 8.0 (GLOVE) ×2 IMPLANT
GLOVE SURG SYN 8.0 PF PI (GLOVE) ×1 IMPLANT
GLOVE SURG UNDER POLY LF SZ6.5 (GLOVE) ×2 IMPLANT
GOWN STRL REUS W/ TWL LRG LVL3 (GOWN DISPOSABLE) ×2 IMPLANT
GOWN STRL REUS W/ TWL XL LVL3 (GOWN DISPOSABLE) ×1 IMPLANT
GOWN STRL REUS W/TWL LRG LVL3 (GOWN DISPOSABLE) ×2
GOWN STRL REUS W/TWL XL LVL3 (GOWN DISPOSABLE) ×1
KIT TURNOVER CYSTO (KITS) ×2 IMPLANT
LABEL OR SOLS (LABEL) ×2 IMPLANT
MANIFOLD NEPTUNE II (INSTRUMENTS) ×2 IMPLANT
NEEDLE HYPO 22GX1.5 SAFETY (NEEDLE) ×4 IMPLANT
PACK BASIN MAJOR ARMC (MISCELLANEOUS) ×2 IMPLANT
PAD DRESSING TELFA 3X8 NADH (GAUZE/BANDAGES/DRESSINGS) ×1 IMPLANT
PENCIL SMOKE EVACUATOR (MISCELLANEOUS) ×2 IMPLANT
RETAINER VISCERA MED (MISCELLANEOUS) IMPLANT
SCRUB EXIDINE 4% CHG 4OZ (MISCELLANEOUS) ×2 IMPLANT
SET CYSTO W/LG BORE CLAMP LF (SET/KITS/TRAYS/PACK) ×1 IMPLANT
SOL PREP PVP 2OZ (MISCELLANEOUS)
SOLUTION PREP PVP 2OZ (MISCELLANEOUS) ×1 IMPLANT
SPONGE T-LAP 18X18 ~~LOC~~+RFID (SPONGE) ×4 IMPLANT
STAPLER INSORB 30 2030 C-SECTI (MISCELLANEOUS) ×1 IMPLANT
STAPLER SKIN PROX 35W (STAPLE) IMPLANT
SURGILUBE 2OZ TUBE FLIPTOP (MISCELLANEOUS) ×1 IMPLANT
SUT VIC AB 0 CT1 27 (SUTURE) ×2
SUT VIC AB 0 CT1 27XCR 8 STRN (SUTURE) ×2 IMPLANT
SUT VIC AB 0 CT1 36 (SUTURE) ×7 IMPLANT
SUT VIC AB 2-0 SH 27 (SUTURE) ×1
SUT VIC AB 2-0 SH 27XBRD (SUTURE) ×4 IMPLANT
SUT VIC AB 3-0 SH 27 (SUTURE) ×2
SUT VIC AB 3-0 SH 27X BRD (SUTURE) IMPLANT
SYR 20ML LL LF (SYRINGE) ×4 IMPLANT
SYR 30ML LL (SYRINGE) ×2 IMPLANT
SYR BULB IRRIG 60ML STRL (SYRINGE) ×2 IMPLANT
TRAY FOLEY MTR SLVR 16FR STAT (SET/KITS/TRAYS/PACK) ×2 IMPLANT
WATER STERILE IRR 1000ML POUR (IV SOLUTION) ×1 IMPLANT
WATER STERILE IRR 500ML POUR (IV SOLUTION) ×2 IMPLANT

## 2021-05-14 NOTE — Anesthesia Preprocedure Evaluation (Addendum)
Anesthesia Evaluation  Patient identified by MRN, date of birth, ID band Patient awake    Reviewed: Allergy & Precautions, H&P , NPO status , Patient's Chart, lab work & pertinent test results  History of Anesthesia Complications Negative for: history of anesthetic complications  Airway Mallampati: II  TM Distance: >3 FB Neck ROM: full    Dental no notable dental hx.    Pulmonary sleep apnea , neg COPD,    Pulmonary exam normal        Cardiovascular hypertension, (-) angina(-) Past MI and (-) Cardiac Stents Normal cardiovascular exam(-) dysrhythmias      Neuro/Psych PSYCHIATRIC DISORDERS Anxiety negative neurological ROS     GI/Hepatic negative GI ROS, Neg liver ROS,   Endo/Other  diabetes  Renal/GU      Musculoskeletal   Abdominal   Peds  Hematology  (+) Blood dyscrasia (Hgb 10.5 g/dL), anemia ,   Anesthesia Other Findings Past Medical History: No date: Anemia 09/2018: Gestational diabetes No date: Hypertension     Comment:  with 2020 pregnancy No date: Iron deficiency anemia No date: Reflux No date: Sleep apnea, obstructive     Comment:  no problem since gastric bypass surgery 2015 No date: Vitamin D deficiency  Past Surgical History: 02/04/2019: CESAREAN SECTION WITH BILATERAL TUBAL LIGATION; N/A     Comment:  Procedure: CESAREAN SECTION WITH BILATERAL TUBAL               LIGATION;  Surgeon: Benjaman Kindler, MD;  Location: [facility]              ORS;  Service: Obstetrics;  Laterality: N/A; 2000: DILATION AND CURETTAGE OF UTERUS 2015: [place] RESECTION 2020: TUBAL LIGATION     Reproductive/Obstetrics menorrhagia                            Anesthesia Physical Anesthesia Plan  ASA: 2  Anesthesia Plan: General ETT   Post-op Pain Management:    Induction: Intravenous  PONV Risk Score and Plan: Ondansetron, Dexamethasone, Midazolam, Treatment may vary due  to age or medical condition, Propofol infusion, TIVA and Scopolamine patch - Pre-op  Airway Management Planned: Oral ETT  Additional Equipment:   Intra-op Plan:   Post-operative Plan:   Informed Consent: I have reviewed the patients History and Physical, chart, labs and discussed the procedure including the risks, benefits and alternatives for the proposed anesthesia with the patient or authorized representative who has indicated his/her understanding and acceptance.     Dental Advisory Given  Plan Discussed with: Anesthesiologist, CRNA and Surgeon  Anesthesia Plan Comments:        Anesthesia Quick Evaluation

## 2021-05-14 NOTE — Progress Notes (Signed)
Subjective: Patient reports incisional pain.   Required Iv morphine Comfortable now  Objective: I have reviewed patient's vital signs, intake and output, and medications.    Abd : incision covered and dry   Assessment/Plan: DOS , stable  D/c foley in am . Encourage IS   LOS: 0 days    Gwen Her Riker Collier 05/14/2021, 4:50 PM

## 2021-05-14 NOTE — Brief Op Note (Signed)
05/14/2021  1:03 PM  PATIENT:  Angie Lamb  40 y.o. female  PRE-OPERATIVE DIAGNOSIS:  menorrhagia, fibroids  POST-OPERATIVE DIAGNOSIS:  menorrhagia, fibroids  PROCEDURE:  Procedure(s): HYSTERECTOMY ABDOMINAL WITH SALPINGECTOMY (Bilateral)  SURGEON:  Surgeon(s) and Role:    * Delta Pichon, Gwen Her, MD - Primary    * Benjaman Kindler, MD - Assisting  PHYSICIAN ASSISTANT:   ASSISTANTS: none   ANESTHESIA:   general  EBL:  100 mL  iof 1300 cc  BLOOD ADMINISTERED:none  DRAINS: Urinary Catheter (Foley)   LOCAL MEDICATIONS USED:  MARCAINE     SPECIMEN:  Source of Specimen:  cervix and uterus   DISPOSITION OF SPECIMEN:  PATHOLOGY  COUNTS:  YES  TOURNIQUET:  * No tourniquets in log *  DICTATION: .Other Dictation: Dictation Number verbal  PLAN OF CARE: Admit to inpatient   PATIENT DISPOSITION:  PACU - hemodynamically stable.   Delay start of Pharmacological VTE agent (>24hrs) due to surgical blood loss or risk of bleeding: not applicable

## 2021-05-14 NOTE — Anesthesia Postprocedure Evaluation (Signed)
Anesthesia Post Note  Patient: Angie Lamb  Procedure(s) Performed: HYSTERECTOMY ABDOMINAL WITH SALPINGECTOMY (Bilateral: Abdomen)  Patient location during evaluation: PACU Anesthesia Type: General Level of consciousness: awake and alert Pain management: pain level controlled Vital Signs Assessment: post-procedure vital signs reviewed and stable Respiratory status: spontaneous breathing, nonlabored ventilation and respiratory function stable Cardiovascular status: blood pressure returned to baseline and stable Postop Assessment: no apparent nausea or vomiting Anesthetic complications: no   No notable events documented.   Last Vitals:  Vitals:   05/14/21 1435 05/14/21 1452  BP: 127/78 125/81  Pulse: 75 76  Resp: 15 16  Temp:  36.7 C  SpO2: 98% 100%    Last Pain:  Vitals:   05/14/21 1452  TempSrc: Oral  PainSc: [place]

## 2021-05-14 NOTE — Transfer of Care (Signed)
Immediate Anesthesia Transfer of Care Note  Patient: Angie Lamb  Procedure(s) Performed: Procedure(s): HYSTERECTOMY ABDOMINAL WITH SALPINGECTOMY (Bilateral)  Patient Location: PACU  Anesthesia Type:General  Level of Consciousness: sedated  Airway & Oxygen Therapy: Patient Spontanous Breathing and Patient connected to face mask oxygen  Post-op Assessment: Report given to RN and Post -op Vital signs reviewed and stable  Post vital signs: Reviewed and stable  Last Vitals:  Vitals:   05/14/21 1015 05/14/21 1320  BP: 115/82 109/77  Pulse: 87 90  Resp: 16 (!) 25  Temp: 36.6 C (!) 36 C  SpO2: 834% 621%    Complications: No apparent anesthesia complications

## 2021-05-14 NOTE — Progress Notes (Signed)
Pt here  for TAH , bilateral salpingectomy  NPO except low carb preop drink . Labs reviewed . All questions answered . Procced

## 2021-05-14 NOTE — Anesthesia Procedure Notes (Signed)
Procedure Name: Intubation Date/Time: 05/14/2021 10:44 AM Performed by: Doreen Salvage, CRNA Pre-anesthesia Checklist: Patient identified, Patient being monitored, Timeout performed, Emergency Drugs available and Suction available Patient Re-evaluated:Patient Re-evaluated prior to induction Oxygen Delivery Method: Circle system utilized Preoxygenation: Pre-oxygenation with 100% oxygen Induction Type: IV induction Ventilation: Mask ventilation without difficulty Laryngoscope Size: Mac, 3 and McGraph Grade View: Grade I Tube type: Oral Tube size: 7.0 mm Number of attempts: 1 Airway Equipment and Method: Stylet Placement Confirmation: ETT inserted through vocal cords under direct vision, positive ETCO2 and breath sounds checked- equal and bilateral Secured at: 21 cm Tube secured with: Tape Dental Injury: Teeth and Oropharynx as per pre-operative assessment

## 2021-05-15 ENCOUNTER — Encounter: Payer: Self-pay | Admitting: Obstetrics and Gynecology

## 2021-05-15 LAB — CBC
HCT: 31.3 % — ABNORMAL LOW (ref 36.0–46.0)
Hemoglobin: 10.2 g/dL — ABNORMAL LOW (ref 12.0–15.0)
MCH: 29.6 pg (ref 26.0–34.0)
MCHC: 32.6 g/dL (ref 30.0–36.0)
MCV: 90.7 fL (ref 80.0–100.0)
Platelets: 325 10*3/uL (ref 150–400)
RBC: 3.45 MIL/uL — ABNORMAL LOW (ref 3.87–5.11)
RDW: 12.3 % (ref 11.5–15.5)
WBC: 15.7 10*3/uL — ABNORMAL HIGH (ref 4.0–10.5)
nRBC: 0 % (ref 0.0–0.2)

## 2021-05-15 LAB — BASIC METABOLIC PANEL
Anion gap: 7 (ref 5–15)
BUN: 7 mg/dL (ref 6–20)
CO2: 25 mmol/L (ref 22–32)
Calcium: 9.3 mg/dL (ref 8.9–10.3)
Chloride: 97 mmol/L — ABNORMAL LOW (ref 98–111)
Creatinine, Ser: 0.56 mg/dL (ref 0.44–1.00)
GFR, Estimated: >60 mL/min (ref >60)
Glucose, Bld: 113 mg/dL — ABNORMAL HIGH (ref 70–99)
Potassium: 4 mmol/L (ref 3.5–5.1)
Sodium: 129 mmol/L — ABNORMAL LOW (ref 135–145)

## 2021-05-15 MED ORDER — GABAPENTIN 300 MG PO CAPS: 300.0000 mg | ORAL_CAPSULE | Freq: Every day | ORAL | 2 refills | Status: DC

## 2021-05-15 MED ORDER — ONDANSETRON HCL 4 MG PO TABS: 4.0000 mg | ORAL_TABLET | Freq: Every day | ORAL | 1 refills | Status: DC | PRN

## 2021-05-15 MED ORDER — HYDROCODONE-ACETAMINOPHEN 5-325 MG PO TABS: 1.0000 | ORAL_TABLET | Freq: Four times a day (QID) | ORAL | 0 refills | Status: DC | PRN

## 2021-05-15 NOTE — Discharge Summary (Signed)
Physician Discharge Summary  Patient ID: ARINE FOLEY MRN: [id number] DOB/AGE: [birth year] 40 y.o.  Admit date: 05/14/2021 Discharge date: 05/15/2021  Admission Diagnoses:menorrhagia , fibroid UTX   Discharge Diagnoses: same , s/p TAh . Stable  Active Problems:   Postoperative state   Discharged Condition: good  Hospital Course: she underwent a TAH . Post op did well minimal requirement for pain .   Consults: None  Significant Diagnostic Studies: labs:  Results for orders placed or performed during the hospital encounter of 05/14/21 (from the past 24 hour(s))  CBC     Status: Abnormal   Collection Time: 05/15/21  5:16 AM  Result Value Ref Range   WBC 15.7 (H) 4.0 - 10.5 K/uL   RBC 3.45 (L) 3.87 - 5.11 MIL/uL   Hemoglobin 10.2 (L) 12.0 - 15.0 g/dL   HCT 31.3 (L) 36.0 - 46.0 %   MCV 90.7 80.0 - 100.0 fL   MCH 29.6 26.0 - 34.0 pg   MCHC 32.6 30.0 - 36.0 g/dL   RDW 12.3 11.5 - 15.5 %   Platelets 325 150 - 400 K/uL   nRBC 0.0 0.0 - 0.2 %  Basic metabolic panel     Status: Abnormal   Collection Time: 05/15/21  5:16 AM  Result Value Ref Range   Sodium 129 (L) 135 - 145 mmol/L   Potassium 4.0 3.5 - 5.1 mmol/L   Chloride 97 (L) 98 - 111 mmol/L   CO2 25 22 - 32 mmol/L   Glucose, Bld 113 (H) 70 - 99 mg/dL   BUN 7 6 - 20 mg/dL   Creatinine, Ser 0.56 0.44 - 1.00 mg/dL   Calcium 9.3 8.9 - 10.3 mg/dL   GFR, Estimated >60 >60 mL/min   Anion gap 7 5 - 15     Treatments: surgery: as above   Discharge Exam: Blood pressure 114/69, pulse 99, temperature 98.6 F (37 C), temperature source Oral, resp. rate 20, height 5\' 1"  (1.549 m), weight 75.8 kg, last menstrual period 04/21/2021, SpO2 100 %. General appearance: alert and cooperative Resp: clear to auscultation bilaterally Cardio: regular rate and rhythm, S1, S2 normal, no murmur, click, rub or gallop GI: soft, non-tender; bowel sounds normal; no masses,  no organomegaly  Disposition: Discharge disposition: 01-Home or Self  Care    d/c home   Precautions given   RTC 2 weeks in clinic   Wound care discussed   Discharge Instructions     Call MD for:  difficulty breathing, headache or visual disturbances   Complete by: As directed    Call MD for:  hives   Complete by: As directed    Call MD for:  persistant dizziness or light-headedness   Complete by: As directed    Call MD for:  persistant nausea and vomiting   Complete by: As directed    Call MD for:  redness, tenderness, or signs of infection (pain, swelling, redness, odor or green/yellow discharge around incision site)   Complete by: As directed    Call MD for:  severe uncontrolled pain   Complete by: As directed    Call MD for:  temperature >100.4   Complete by: As directed    Diet - low sodium heart healthy   Complete by: As directed    Discharge wound care:   Complete by: As directed    Honeycomb for 10 days , remove then   Increase activity slowly   Complete by: As directed  Allergies as of 05/15/2021       Reactions   Nsaids Other (See Comments)   Avoids due to history of bariatric surgery        Medication List     STOP taking these medications    phentermine 37.5 MG tablet Commonly known as: ADIPEX-P       TAKE these medications    acetaminophen 500 MG tablet Commonly known as: TYLENOL Take 500 mg by mouth every 6 (six) hours as needed for mild pain.   gabapentin 300 MG capsule Commonly known as: Neurontin Take 1 capsule (300 mg total) by mouth at bedtime for 10 days.   HYDROcodone-acetaminophen 5-325 MG tablet Commonly known as: NORCO/VICODIN Take 1 tablet by mouth every 6 (six) hours as needed for moderate pain.   ondansetron 4 MG tablet Commonly known as: Zofran Take 1 tablet (4 mg total) by mouth daily as needed for nausea or vomiting.               Discharge Care Instructions  (From admission, onward)           Start     Ordered   05/15/21 0000  Discharge wound care:        Comments: Honeycomb for 10 days , remove then   05/15/21 1642            Follow-up Information     Ausencio Vaden, Gwen Her, MD Follow up in 2 week(s).   Specialty: Obstetrics and Gynecology Why: For wound re-check Contact information: [address] [place] [address] [phone]                 Signed: Gwen Her Nahshon Reich 05/15/2021, 4:43 PM

## 2021-05-15 NOTE — Op Note (Signed)
NAMESCHUYLER, BEHAN MEDICAL RECORD NO: [id number] ACCOUNT NO: [id number] DATE OF BIRTH: 1981/04/19 FACILITY: [facility] LOCATION: [facility] PHYSICIAN: Boykin Nearing, MD  Operative Report   DATE OF PROCEDURE: 05/14/2021  PREOPERATIVE DIAGNOSES: 1.  Menorrhagia. 2.  Fibroid uterus.  POSTOPERATIVE DIAGNOSES:   1.  Menorrhagia. 2.  Fibroid uterus.  PROCEDURE:  Total abdominal hysterectomy.  SURGEON:  Boykin Nearing, MD  ASSISTANT:  Benjaman Kindler, MD  ANESTHESIA:  General endotracheal anesthesia.  INDICATIONS:  A 40 year old gravida 5, para 4 patient with known fibroid uterus with two specific fibroids measuring 8 cm and 6 cm.  The patient has had significant menorrhagia, uncontrolled by conservative measures.  The patient is status post tubal  ligation.  DESCRIPTION OF PROCEDURE:  After adequate general endotracheal anesthesia, the patient was placed in dorsal supine position with the legs in the [place] stirrups.  Timeout was performed.  The patient was prepped and draped in normal sterile fashion.  He  did receive 2 grams of IV Ancef for surgical prophylaxis prior to commencement of the case.  A Pfannenstiel incision was made 2 fingerbreadths above the symphysis pubis.  Sharp dissection was used to identify the fascia.  Fascia was opened in the midline  and opened in a transverse fashion.  The superior aspect of the fascia was grasped with Kocher clamps and the recti muscles were dissected free.  The inferior aspect of the fascia was grasped with Kocher clamps and the pyramidalis muscle was dissected  free.  Peritoneum was opened sharply without difficulty.  An O'Connor-O'Sullivan retractor was placed into the incision and the bowel was packed cephalad with moist laparotomy sponges.  Large fibroid uterus was noted with both ovaries attached to the  lateral aspect of the uterus.  The patient was status post prior bilateral tubal ligation and distal portion of the fallopian  tubes were not identified specifically.  A window was placed in the broad ligament and the uteroovarian ligaments were  bilaterally clamped, transected and suture ligated with 0 Vicryl suture.  This allowed the ovaries to fall laterally into a normal anatomic position.  Broad round ligaments were bilaterally clamped, transected and suture ligated with 0 Vicryl suture and  the broad ligament was then opened and the uterine arteries were skeletonized bilaterally.  Uterine arteries were then bilaterally clamped, transected and suture ligated with 0 Vicryl suture.  Given the girth and the width of the uterus the uterus was  then amputated above the previously placed uterine artery, ligating stitches.  Bladder flap was then taken down with sharp and blunt dissection.  The cardinal ligaments were then clamped, transected and suture ligated with 0 Vicryl suture and curved  Heaney clamps were used to place at the angles and the cervix was then removed.  Vaginal cuff was then closed with interrupted 0 Vicryl suture.  Good hemostasis was noted.  The ureters were visualized bilaterally with normal peristaltic activity.  The  patient's abdomen was copiously irrigated and good hemostasis was noted.  All pedicles appeared hemostatic.  The O'Connor-O'Sullivan retractor was removed and all laparotomy sponges were removed.  The fascia was then closed with 0 Vicryl suture in a  running nonlocking fashion, two separate sutures were used.  Subcutaneous tissues were irrigated and bovied for hemostasis.  Fascial edges were injected with a solution of 60 mL of 0.5% Marcaine plus 20 mL normal saline.  Approximately 50 mL of this  solution was injected at the fascial plane.  The skin was reapproximated with  Insorb staples and an additional 20 mL of Marcaine solution was injected at the skin level.  There were no complications.  ESTIMATED BLOOD LOSS:  100 mL.  INTRAOPERATIVE FLUIDS:  1400 mL.  URINE OUTPUT:  150 mL.  The  patient was taken to recovery room in good condition.   PUS D: 05/14/2021 2:41:02 pm T: 05/15/2021 3:27:00 am  JOB: [id number]/ [id number]

## 2021-05-15 NOTE — Progress Notes (Signed)
Subjective: Patient reports pain in good control . Urinating without difficulty  Small po intake .    Objective: I have reviewed patient's vital signs, medications, and labs.  General: alert and cooperative Resp: clear to auscultation bilaterally Cardio: regular rate and rhythm, S1, S2 normal, no murmur, click, rub or gallop GI: soft, non-tender; bowel sounds normal; no masses,  no organomegaly Incision : c/d/i Results for orders placed or performed during the hospital encounter of 05/14/21 (from the past 24 hour(s))  Pregnancy, urine POC     Status: None   Collection Time: 05/14/21  9:46 AM  Result Value Ref Range   Preg Test, Ur NEGATIVE NEGATIVE  Glucose, capillary     Status: Abnormal   Collection Time: 05/14/21  1:41 PM  Result Value Ref Range   Glucose-Capillary 110 (H) 70 - 99 mg/dL  CBC     Status: Abnormal   Collection Time: 05/15/21  5:16 AM  Result Value Ref Range   WBC 15.7 (H) 4.0 - 10.5 K/uL   RBC 3.45 (L) 3.87 - 5.11 MIL/uL   Hemoglobin 10.2 (L) 12.0 - 15.0 g/dL   HCT 31.3 (L) 36.0 - 46.0 %   MCV 90.7 80.0 - 100.0 fL   MCH 29.6 26.0 - 34.0 pg   MCHC 32.6 30.0 - 36.0 g/dL   RDW 12.3 11.5 - 15.5 %   Platelets 325 150 - 400 K/uL   nRBC 0.0 0.0 - 0.2 %  Basic metabolic panel     Status: Abnormal   Collection Time: 05/15/21  5:16 AM  Result Value Ref Range   Sodium 129 (L) 135 - 145 mmol/L   Potassium 4.0 3.5 - 5.1 mmol/L   Chloride 97 (L) 98 - 111 mmol/L   CO2 25 22 - 32 mmol/L   Glucose, Bld 113 (H) 70 - 99 mg/dL   BUN 7 6 - 20 mg/dL   Creatinine, Ser 0.56 0.44 - 1.00 mg/dL   Calcium 9.3 8.9 - 10.3 mg/dL   GFR, Estimated >60 >60 mL/min   Anion gap 7 5 - 15    Assessment/Plan: Doing well , hemodynamically stable  Saline lock IV  Continue po toradol and possible d/c this afternoon   LOS: 1 day    Gwen Her Annabeth Tortora 05/15/2021, 8:48 AM

## 2021-05-15 NOTE — Progress Notes (Signed)
Patient discharged, discharge instructions given to patient.  All questions answered.  Patient transported by auxiliary.

## 2021-05-16 LAB — SURGICAL PATHOLOGY

## 2021-09-10 ENCOUNTER — Telehealth: Payer: Self-pay

## 2021-09-10 NOTE — Telephone Encounter (Signed)
Left vm and sent mychart message to confirm 09/13/21 appointment-Toni ?

## 2021-09-13 ENCOUNTER — Encounter: Payer: Self-pay | Admitting: Nurse Practitioner

## 2021-09-13 ENCOUNTER — Ambulatory Visit: Payer: Medicaid Other | Admitting: Nurse Practitioner

## 2021-09-13 ENCOUNTER — Other Ambulatory Visit: Payer: Self-pay

## 2021-09-13 VITALS — BP 117/78 | HR 80 | Temp 98.9°F | Resp 16 | Ht 61.0 in | Wt 156.4 lb

## 2021-09-13 DIAGNOSIS — Z0001 Encounter for general adult medical examination with abnormal findings: Secondary | ICD-10-CM | POA: Diagnosis not present

## 2021-09-13 DIAGNOSIS — R300 Dysuria: Secondary | ICD-10-CM

## 2021-09-13 DIAGNOSIS — D509 Iron deficiency anemia, unspecified: Secondary | ICD-10-CM

## 2021-09-13 DIAGNOSIS — E559 Vitamin D deficiency, unspecified: Secondary | ICD-10-CM

## 2021-09-13 DIAGNOSIS — Z1231 Encounter for screening mammogram for malignant neoplasm of breast: Secondary | ICD-10-CM

## 2021-09-13 DIAGNOSIS — E782 Mixed hyperlipidemia: Secondary | ICD-10-CM | POA: Diagnosis not present

## 2021-09-13 DIAGNOSIS — E538 Deficiency of other specified B group vitamins: Secondary | ICD-10-CM | POA: Diagnosis not present

## 2021-09-13 NOTE — Progress Notes (Unsigned)
[facility] [place], [place] [address]  Internal MEDICINE  Office Visit Note  Patient Name: Angie Lamb  [id number]  [id number]  Date of Service: 09/13/2021  Chief Complaint  Patient presents with   Annual Exam   Hypertension   Anemia   Quality Metric Gaps    mammogram    HPI Tamisha presents for an annual well visit and physical exam.  She is a well-appearing 41 year old female with a history of anemia status post hysterectomy in November last year.  They found uterine fibroids during her last pregnancy which was causing her to have heavy periods which caused iron deficiency anemia.  She was previously receiving iron infusions but this has not been an issue since her surgery.  She is due for routine labs.  She is due for a routine screening mammogram.  She is not due for any other preventive screenings and her blood pressure and vital signs are within normal limits.  She has no other significant medical conditions and she is not on any medications at this time.  She denies any additional concerns or questions.  She denies any pain.    Current Medication: Outpatient Encounter Medications as of 09/13/2021  Medication Sig   [DISCONTINUED] acetaminophen (TYLENOL) 500 MG tablet Take 500 mg by mouth every 6 (six) hours as needed for mild pain. (Patient not taking: Reported on 09/13/2021)   [DISCONTINUED] gabapentin (NEURONTIN) 300 MG capsule Take 1 capsule (300 mg total) by mouth at bedtime for 10 days.   [DISCONTINUED] HYDROcodone-acetaminophen (NORCO/VICODIN) 5-325 MG tablet Take 1 tablet by mouth every 6 (six) hours as needed for moderate pain. (Patient not taking: Reported on 09/13/2021)   [DISCONTINUED] ondansetron (ZOFRAN) 4 MG tablet Take 1 tablet (4 mg total) by mouth daily as needed for nausea or vomiting. (Patient not taking: Reported on 09/13/2021)   No facility-administered encounter medications on file as of 09/13/2021.    Surgical History: Past Surgical  History:  Procedure Laterality Date   CESAREAN SECTION WITH BILATERAL TUBAL LIGATION N/A 02/04/2019   Procedure: CESAREAN SECTION WITH BILATERAL TUBAL LIGATION;  Surgeon: Benjaman Kindler, MD;  Location: [facility] ORS;  Service: Obstetrics;  Laterality: N/A;   DILATION AND CURETTAGE OF UTERUS  2000   HYSTERECTOMY ABDOMINAL WITH SALPINGECTOMY Bilateral 05/14/2021   Procedure: HYSTERECTOMY ABDOMINAL WITH SALPINGECTOMY;  Surgeon: Schermerhorn, Gwen Her, MD;  Location: [facility] OR[facility];  Service: Gynecology;  Laterality: Bilateral;   LAPAROSCOPIC GASTRIC SLEEVE RESECTION  2015   TUBAL LIGATION  2020    Medical History: Past Medical History:  Diagnosis Date   Anemia    Gestational diabetes 09/2018   Hypertension    with 2020 pregnancy   Iron deficiency anemia    Reflux    Sleep apnea, obstructive    no problem since gastric bypass surgery 2015   Vitamin D deficiency     Family History: Family History  Problem Relation Age of Onset   Diabetes Paternal Grandmother    Thyroid disease Mother    Thyroid disease Father     Social History   Socioeconomic History   Marital status: Single    Spouse name: Not on file   Number of children: 4   Years of education: Not on file   Highest education level: Not on file  Occupational History   Not on file  Tobacco Use   Smoking status: Never   Smokeless tobacco: Never  Vaping Use   Vaping Use: Never used  Substance and Sexual Activity  Alcohol use: Yes    Comment: Occasionally every few months    Drug use: No   Sexual activity: Yes    Birth control/protection: Surgical  Other Topics Concern   Not on file  Social History Narrative   Not on file   Social Determinants of Health   Financial Resource Strain: Not on file  Food Insecurity: Not on file  Transportation Needs: Not on file  Physical Activity: Not on file  Stress: Not on file  Social Connections: Not on file  Intimate Partner Violence: Not on file      Review of Systems   Constitutional:  Negative for activity change, appetite change, chills, fatigue, fever and unexpected weight change.  HENT: Negative.  Negative for congestion, ear pain, rhinorrhea, sore throat and trouble swallowing.   Eyes: Negative.   Respiratory: Negative.  Negative for cough, chest tightness, shortness of breath and wheezing.   Cardiovascular: Negative.  Negative for chest pain.  Gastrointestinal: Negative.  Negative for abdominal pain, blood in stool, constipation, diarrhea, nausea and vomiting.  Endocrine: Negative.   Genitourinary: Negative.  Negative for difficulty urinating, dysuria, frequency, hematuria and urgency.  Musculoskeletal: Negative.  Negative for arthralgias, back pain, joint swelling, myalgias and neck pain.  Skin: Negative.  Negative for rash and wound.  Allergic/Immunologic: Negative.  Negative for immunocompromised state.  Neurological: Negative.  Negative for dizziness, seizures, numbness and headaches.  Hematological: Negative.   Psychiatric/Behavioral: Negative.  Negative for behavioral problems, self-injury and suicidal ideas. The patient is not nervous/anxious.    Vital Signs: BP 117/78    Pulse 80    Temp 98.9 F (37.2 C)    Resp 16    Ht '5\' 1"'$  (1.549 m)    Wt 156 lb 6.4 oz (70.9 kg)    SpO2 99%    BMI 29.55 kg/m    Physical Exam Vitals reviewed.  Constitutional:      General: She is not in acute distress.    Appearance: Normal appearance. She is well-developed. She is not ill-appearing or diaphoretic.  HENT:     Head: Normocephalic and atraumatic.     Right Ear: Tympanic membrane, ear canal and external ear normal.     Left Ear: Tympanic membrane, ear canal and external ear normal.     Nose: Nose normal.     Mouth/Throat:     Mouth: Mucous membranes are moist.     Pharynx: Oropharynx is clear. No oropharyngeal exudate or posterior oropharyngeal erythema.  Eyes:     General: No scleral icterus.       Right eye: No discharge.        Left eye: No  discharge.     Extraocular Movements: Extraocular movements intact.     Conjunctiva/sclera: Conjunctivae normal.     Pupils: Pupils are equal, round, and reactive to light.  Neck:     Thyroid: No thyromegaly.     Vascular: No JVD.     Trachea: No tracheal deviation.  Cardiovascular:     Rate and Rhythm: Normal rate and regular rhythm.     Pulses: Normal pulses.     Heart sounds: Normal heart sounds. No murmur heard.   No friction rub. No gallop.  Pulmonary:     Effort: Pulmonary effort is normal. No respiratory distress.     Breath sounds: Normal breath sounds. No stridor. No wheezing or rales.  Chest:     Chest wall: No tenderness.  Abdominal:     General: Bowel sounds are normal.  There is no distension.     Palpations: Abdomen is soft. There is no mass.     Tenderness: There is no abdominal tenderness. There is no guarding or rebound.  Musculoskeletal:        General: No tenderness or deformity. Normal range of motion.     Cervical back: Normal range of motion and neck supple.  Lymphadenopathy:     Cervical: No cervical adenopathy.  Skin:    General: Skin is warm and dry.     Capillary Refill: Capillary refill takes less than 2 seconds.     Coloration: Skin is not pale.     Findings: No erythema or rash.  Neurological:     Mental Status: She is alert and oriented to person, place, and time.     Cranial Nerves: No cranial nerve deficit.     Motor: No abnormal muscle tone.     Coordination: Coordination normal.     Gait: Gait normal.     Deep Tendon Reflexes: Reflexes are normal and symmetric.  Psychiatric:        Mood and Affect: Mood normal.        Behavior: Behavior normal.        Thought Content: Thought content normal.        Judgment: Judgment normal.       Assessment/Plan: 1. Dysuria - UA/M w/rflx Culture, Routine     General Counseling: Journie verbalizes understanding of the findings of todays visit and agrees with plan of treatment. I have discussed  any further diagnostic evaluation that may be needed or ordered today. We also reviewed her medications today. she has been encouraged to call the office with any questions or concerns that should arise related to todays visit.    Orders Placed This Encounter  Procedures   UA/M w/rflx Culture, Routine    No orders of the defined types were placed in this encounter.   No follow-ups on file.   Total time spent:*** Minutes Time spent includes review of chart, medications, test results, and follow up plan with the patient.   [place] Controlled Substance Database was reviewed by me.  This patient was seen by Jonetta Osgood, FNP-C in collaboration with Dr. Clayborn Bigness as a part of collaborative care agreement.  Kamarion Zagami R. Valetta Fuller, MSN, FNP-C Internal medicine

## 2021-09-16 LAB — MICROSCOPIC EXAMINATION
Casts: NONE SEEN /lpf
Epithelial Cells (non renal): >10 /hpf — AB (ref 0–10)
RBC, Urine: NONE SEEN /hpf (ref 0–2)

## 2021-09-16 LAB — URINE CULTURE, REFLEX

## 2021-09-16 LAB — UA/M W/RFLX CULTURE, ROUTINE
Bilirubin, UA: NEGATIVE
Glucose, UA: NEGATIVE
Leukocytes,UA: NEGATIVE
Nitrite, UA: NEGATIVE
RBC, UA: NEGATIVE
Specific Gravity, UA: >=1.03 — AB (ref 1.005–1.030)
Urobilinogen, Ur: 2 mg/dL — ABNORMAL HIGH (ref 0.2–1.0)
pH, UA: 6.5 (ref 5.0–7.5)

## 2021-09-21 LAB — CMP14+EGFR
ALT: 7 IU/L (ref 0–32)
AST: 16 IU/L (ref 0–40)
Albumin/Globulin Ratio: 1.8 (ref 1.2–2.2)
Albumin: 4.4 g/dL (ref 3.8–4.8)
Alkaline Phosphatase: 53 IU/L (ref 44–121)
BUN/Creatinine Ratio: 16 (ref 9–23)
BUN: 9 mg/dL (ref 6–24)
Bilirubin Total: 0.5 mg/dL (ref 0.0–1.2)
CO2: 23 mmol/L (ref 20–29)
Calcium: 9.4 mg/dL (ref 8.7–10.2)
Chloride: 105 mmol/L (ref 96–106)
Creatinine, Ser: 0.57 mg/dL (ref 0.57–1.00)
Globulin, Total: 2.5 g/dL (ref 1.5–4.5)
Glucose: 89 mg/dL (ref 70–99)
Potassium: 4.3 mmol/L (ref 3.5–5.2)
Sodium: 140 mmol/L (ref 134–144)
Total Protein: 6.9 g/dL (ref 6.0–8.5)
eGFR: 117 mL/min/{1.73_m2} (ref >59)

## 2021-09-21 LAB — VITAMIN D 25 HYDROXY (VIT D DEFICIENCY, FRACTURES): Vit D, 25-Hydroxy: 19.8 ng/mL — ABNORMAL LOW (ref 30.0–100.0)

## 2021-09-21 LAB — TSH+FREE T4
Free T4: 1.27 ng/dL (ref 0.82–1.77)
TSH: 2.01 u[IU]/mL (ref 0.450–4.500)

## 2021-09-21 LAB — LIPID PANEL
Chol/HDL Ratio: 3.2 ratio (ref 0.0–4.4)
Cholesterol, Total: 207 mg/dL — ABNORMAL HIGH (ref 100–199)
HDL: 64 mg/dL (ref >39)
LDL Chol Calc (NIH): 134 mg/dL — ABNORMAL HIGH (ref 0–99)
Triglycerides: 50 mg/dL (ref 0–149)
VLDL Cholesterol Cal: 9 mg/dL (ref 5–40)

## 2021-09-21 LAB — B12 AND FOLATE PANEL
Folate: 8.3 ng/mL (ref >3.0)
Vitamin B-12: >2000 pg/mL — ABNORMAL HIGH (ref 232–1245)

## 2021-09-28 ENCOUNTER — Telehealth: Payer: Self-pay

## 2021-09-28 NOTE — Telephone Encounter (Signed)
-----   Message from Jonetta Osgood, NP sent at 09/28/2021  2:27 PM EDT ----- ?Please call patient and provide her these results: ?-- Her cholesterol levels are abnormal with an elevated total cholesterol 207 and an elevated LDL of 134.  Her triglycerides, HDL, and VLDL are all within normal limits and her cholesterol/HDL ratio is 3.2 which is less than half the average risk of heart disease.  Some interventions to help improve her cholesterol levels include limiting red meat intake, increasing lean protein in her diet and adding an over-the-counter fish oil supplement of 1000 mg daily. ?-- She has chronically low vitamin D her most recent level was 19.8, please send prescription for vitamin D 50,000 unit capsule, take 1 capsule by mouth once weekly, please send 5 capsules with 5 refills.  Will repeat vitamin D level in 3 to 6 months ?--B12 and folate levels are normal ?--Her thyroid levels are normal ?--Her metabolic panel is normal, liver and kidney function are normal ?

## 2021-09-28 NOTE — Progress Notes (Signed)
Please call patient and provide her these results: ?-- Her cholesterol levels are abnormal with an elevated total cholesterol 207 and an elevated LDL of 134.  Her triglycerides, HDL, and VLDL are all within normal limits and her cholesterol/HDL ratio is 3.2 which is less than half the average risk of heart disease.  Some interventions to help improve her cholesterol levels include limiting red meat intake, increasing lean protein in her diet and adding an over-the-counter fish oil supplement of 1000 mg daily. ?-- She has chronically low vitamin D her most recent level was 19.8, please send prescription for vitamin D 50,000 unit capsule, take 1 capsule by mouth once weekly, please send 5 capsules with 5 refills.  Will repeat vitamin D level in 3 to 6 months ?--B12 and folate levels are normal ?--Her thyroid levels are normal ?--Her metabolic panel is normal, liver and kidney function are normal

## 2021-09-28 NOTE — Telephone Encounter (Signed)
LMOM to review results ?

## 2021-10-01 ENCOUNTER — Other Ambulatory Visit: Payer: Self-pay

## 2021-10-04 ENCOUNTER — Encounter: Payer: Self-pay | Admitting: Nurse Practitioner

## 2021-10-05 ENCOUNTER — Telehealth: Payer: Self-pay

## 2021-10-05 NOTE — Telephone Encounter (Signed)
LMOM, need to review labs with pt ?

## 2021-10-05 NOTE — Telephone Encounter (Signed)
-----   Message from Jonetta Osgood, NP sent at 09/28/2021  2:27 PM EDT ----- ?Please call patient and provide her these results: ?-- Her cholesterol levels are abnormal with an elevated total cholesterol 207 and an elevated LDL of 134.  Her triglycerides, HDL, and VLDL are all within normal limits and her cholesterol/HDL ratio is 3.2 which is less than half the average risk of heart disease.  Some interventions to help improve her cholesterol levels include limiting red meat intake, increasing lean protein in her diet and adding an over-the-counter fish oil supplement of 1000 mg daily. ?-- She has chronically low vitamin D her most recent level was 19.8, please send prescription for vitamin D 50,000 unit capsule, take 1 capsule by mouth once weekly, please send 5 capsules with 5 refills.  Will repeat vitamin D level in 3 to 6 months ?--B12 and folate levels are normal ?--Her thyroid levels are normal ?--Her metabolic panel is normal, liver and kidney function are normal ?

## 2021-10-08 ENCOUNTER — Telehealth: Payer: Self-pay

## 2021-10-08 ENCOUNTER — Other Ambulatory Visit: Payer: Self-pay

## 2021-10-08 MED ORDER — ERGOCALCIFEROL 1.25 MG (50000 UT) PO CAPS: 50000.0000 [IU] | ORAL_CAPSULE | ORAL | 5 refills | Status: AC

## 2021-10-08 NOTE — Telephone Encounter (Signed)
-----   Message from Jonetta Osgood, NP sent at 09/28/2021  2:27 PM EDT ----- ?Please call patient and provide her these results: ?-- Her cholesterol levels are abnormal with an elevated total cholesterol 207 and an elevated LDL of 134.  Her triglycerides, HDL, and VLDL are all within normal limits and her cholesterol/HDL ratio is 3.2 which is less than half the average risk of heart disease.  Some interventions to help improve her cholesterol levels include limiting red meat intake, increasing lean protein in her diet and adding an over-the-counter fish oil supplement of 1000 mg daily. ?-- She has chronically low vitamin D her most recent level was 19.8, please send prescription for vitamin D 50,000 unit capsule, take 1 capsule by mouth once weekly, please send 5 capsules with 5 refills.  Will repeat vitamin D level in 3 to 6 months ?--B12 and folate levels are normal ?--Her thyroid levels are normal ?--Her metabolic panel is normal, liver and kidney function are normal ?

## 2021-10-08 NOTE — Telephone Encounter (Signed)
Spoke to pt, reviewed results, med sent to pharmacy, reviewed diet suggestions  ?

## 2021-10-29 ENCOUNTER — Inpatient Hospital Stay: Payer: Medicaid Other | Attending: Oncology

## 2021-10-29 DIAGNOSIS — D509 Iron deficiency anemia, unspecified: Secondary | ICD-10-CM | POA: Diagnosis present

## 2021-10-29 DIAGNOSIS — D500 Iron deficiency anemia secondary to blood loss (chronic): Secondary | ICD-10-CM

## 2021-10-29 LAB — CBC WITH DIFFERENTIAL/PLATELET
Abs Immature Granulocytes: 0.01 10*3/uL (ref 0.00–0.07)
Basophils Absolute: 0 10*3/uL (ref 0.0–0.1)
Basophils Relative: 1 %
Eosinophils Absolute: 0.1 10*3/uL (ref 0.0–0.5)
Eosinophils Relative: 2 %
HCT: 32.9 % — ABNORMAL LOW (ref 36.0–46.0)
Hemoglobin: 10.4 g/dL — ABNORMAL LOW (ref 12.0–15.0)
Immature Granulocytes: 0 %
Lymphocytes Relative: 28 %
Lymphs Abs: 1.7 10*3/uL (ref 0.7–4.0)
MCH: 29.7 pg (ref 26.0–34.0)
MCHC: 31.6 g/dL (ref 30.0–36.0)
MCV: 94 fL (ref 80.0–100.0)
Monocytes Absolute: 0.3 10*3/uL (ref 0.1–1.0)
Monocytes Relative: 5 %
Neutro Abs: 3.7 10*3/uL (ref 1.7–7.7)
Neutrophils Relative %: 64 %
Platelets: 314 10*3/uL (ref 150–400)
RBC: 3.5 MIL/uL — ABNORMAL LOW (ref 3.87–5.11)
RDW: 12.3 % (ref 11.5–15.5)
WBC: 5.8 10*3/uL (ref 4.0–10.5)
nRBC: 0 % (ref 0.0–0.2)

## 2021-10-29 LAB — IRON AND TIBC
Iron: 48 ug/dL (ref 28–170)
Saturation Ratios: 15 % (ref 10.4–31.8)
TIBC: 314 ug/dL (ref 250–450)
UIBC: 266 ug/dL

## 2021-10-29 LAB — FERRITIN: Ferritin: 74 ng/mL (ref 11–307)

## 2021-10-30 ENCOUNTER — Ambulatory Visit
Admission: RE | Admit: 2021-10-30 | Discharge: 2021-10-30 | Disposition: A | Discharge status: Home / Self Care | Payer: Medicaid Other | Source: Ambulatory Visit | Attending: Nurse Practitioner | Admitting: Nurse Practitioner

## 2021-10-30 DIAGNOSIS — Z1231 Encounter for screening mammogram for malignant neoplasm of breast: Secondary | ICD-10-CM | POA: Insufficient documentation

## 2021-10-31 ENCOUNTER — Encounter: Payer: Self-pay | Admitting: Oncology

## 2021-10-31 ENCOUNTER — Inpatient Hospital Stay (HOSPITAL_BASED_OUTPATIENT_CLINIC_OR_DEPARTMENT_OTHER): Payer: Medicaid Other | Admitting: Oncology

## 2021-10-31 DIAGNOSIS — D500 Iron deficiency anemia secondary to blood loss (chronic): Secondary | ICD-10-CM

## 2021-10-31 DIAGNOSIS — D509 Iron deficiency anemia, unspecified: Secondary | ICD-10-CM | POA: Diagnosis not present

## 2021-10-31 MED ORDER — VITRON-C 65-125 MG PO TABS: 1.0000 | ORAL_TABLET | Freq: Every day | ORAL | 3 refills | Status: DC

## 2021-10-31 NOTE — Progress Notes (Signed)
Pt contacted for Mychart visit. No new concerns voiced.  

## 2021-10-31 NOTE — Progress Notes (Addendum)
HEMATOLOGY-ONCOLOGY TeleHEALTH VISIT PROGRESS NOTE  ?I connected with Angie Lamb on 10/31/21  at  9:20 AM EDT by video enabled telemedicine visit and verified that I am speaking with the correct person using two identifiers. ?I discussed the limitations, risks, security and privacy concerns of performing an evaluation and management service by telemedicine and the availability of in-person appointments. The patient expressed understanding and agreed to proceed.  ? ?Other persons participating in the visit and their role in the encounter:  ?None ? ?Patient's location: Home  ?Provider's location: office ?Chief Complaint: follow up for IDA ? ? ?INTERVAL HISTORY ?Angie Lamb is a 41 y.o. female who has above history reviewed by me today presents for follow up visit for management of IDA ?Patient reports feeling well.  She recently started a new job.  Status post hysterectomy on 04/21/2021. ? ?Review of Systems  ?Constitutional:  Negative for appetite change, chills, fatigue and fever.  ?HENT:   Negative for hearing loss and voice change.   ?Eyes:  Negative for eye problems.  ?Respiratory:  Negative for chest tightness and cough.   ?Cardiovascular:  Negative for chest pain.  ?Gastrointestinal:  Negative for abdominal distention, abdominal pain and blood in stool.  ?Endocrine: Negative for hot flashes.  ?Genitourinary:  Negative for difficulty urinating and frequency.   ?Musculoskeletal:  Negative for arthralgias.  ?Skin:  Negative for itching and rash.  ?Neurological:  Negative for extremity weakness.  ?Hematological:  Negative for adenopathy.  ?Psychiatric/Behavioral:  Negative for confusion.    ?Past Medical History:  ?Diagnosis Date  ? Anemia   ? Gestational diabetes 09/2018  ? Hypertension   ? with 2020 pregnancy  ? Iron deficiency anemia   ? Reflux   ? Sleep apnea, obstructive   ? no problem since gastric bypass surgery 2015  ? Vitamin D deficiency   ? ?Past Surgical History:  ?Procedure Laterality Date  ?  CESAREAN SECTION WITH BILATERAL TUBAL LIGATION N/A 02/04/2019  ? Procedure: CESAREAN SECTION WITH BILATERAL TUBAL LIGATION;  Surgeon: Benjaman Kindler, MD;  Location: [facility];  Service: Obstetrics;  Laterality: N/A;  ? [place] OF UTERUS  2000  ? HYSTERECTOMY ABDOMINAL WITH SALPINGECTOMY Bilateral 05/14/2021  ? Procedure: HYSTERECTOMY ABDOMINAL WITH SALPINGECTOMY;  Surgeon: Schermerhorn, Gwen Her, MD;  Location: [facility] ORS;  Service: Gynecology;  Laterality: Bilateral;  ? LAPAROSCOPIC GASTRIC SLEEVE RESECTION  2015  ? TUBAL LIGATION  2020  ?  ?Family History  ?Problem Relation Age of Onset  ? Thyroid disease Mother   ? Thyroid disease Father   ? Diabetes Paternal Grandmother   ? Breast cancer Neg Hx   ?  ?Social History  ? ?Socioeconomic History  ? Marital status: Single  ?  Spouse name: Not on file  ? Number of children: 4  ? Years of education: Not on file  ? Highest education level: Not on file  ?Occupational History  ? Not on file  ?Tobacco Use  ? Smoking status: Never  ? Smokeless tobacco: Never  ?Vaping Use  ? Vaping Use: Never used  ?Substance and Sexual Activity  ? Alcohol use: Yes  ?  Comment: Occasionally every few months   ? Drug use: No  ? Sexual activity: Yes  ?  Birth control/protection: Surgical  ?Other Topics Concern  ? Not on file  ?Social History Narrative  ? Not on file  ? ?Social Determinants of Health  ? ?Financial Resource Strain: Not on file  ?Food Insecurity: Not on file  ?Transportation  Needs: Not on file  ?Physical Activity: Not on file  ?Stress: Not on file  ?Social Connections: Not on file  ?Intimate Partner Violence: Not on file  ?  ?Current Outpatient Medications on File Prior to Visit  ?Medication Sig Dispense Refill  ? ergocalciferol (VITAMIN D2) 1.25 MG (50000 UT) capsule Take 1 capsule (50,000 Units total) by mouth once a week. 5 capsule 5  ? phentermine (ADIPEX-P) 37.5 MG tablet Take by mouth.    ? ?No current facility-administered medications on file prior to visit.  ?   ?Allergies  ?Allergen Reactions  ? Nsaids Other (See Comments)  ?  Avoids due to history of bariatric surgery  ?  ? ?  ?Observations/Objective: ?There were no vitals filed for this visit. ? ?There is no height or weight on file to calculate BMI.  ?Physical Exam ?Neurological:  ?   Mental Status: She is alert.  ? ? ?CBC ?   ?Component Value Date/Time  ? WBC 5.8 10/29/2021 1544  ? RBC 3.50 (L) 10/29/2021 1544  ? HGB 10.4 (L) 10/29/2021 1544  ? HGB 7.1 (L) 04/04/2020 0734  ? HCT 32.9 (L) 10/29/2021 1544  ? HCT 24.7 (L) 04/04/2020 0734  ? PLT 314 10/29/2021 1544  ? PLT 533 (H) 04/04/2020 0734  ? MCV 94.0 10/29/2021 1544  ? MCV 78 (L) 04/04/2020 0734  ? MCV 88 11/24/2013 0458  ? MCH 29.7 10/29/2021 1544  ? MCHC 31.6 10/29/2021 1544  ? RDW 12.3 10/29/2021 1544  ? RDW 17.9 (H) 04/04/2020 0734  ? RDW 13.3 11/24/2013 0458  ? LYMPHSABS 1.7 10/29/2021 1544  ? LYMPHSABS 0.7 (L) 11/24/2013 0458  ? MONOABS 0.3 10/29/2021 1544  ? MONOABS 0.4 11/24/2013 0458  ? EOSABS 0.1 10/29/2021 1544  ? EOSABS 0.0 11/24/2013 0458  ? BASOSABS 0.0 10/29/2021 1544  ? BASOSABS 0.0 11/24/2013 0458  ?  ?CMP  ?   ?Component Value Date/Time  ? NA 140 09/20/2021 0755  ? NA 133 (L) 11/24/2013 0458  ? K 4.3 09/20/2021 0755  ? K 3.8 11/24/2013 0458  ? CL 105 09/20/2021 0755  ? CL 104 11/24/2013 0458  ? CO2 23 09/20/2021 0755  ? CO2 22 11/24/2013 0458  ? GLUCOSE 89 09/20/2021 0755  ? GLUCOSE 113 (H) 05/15/2021 0516  ? GLUCOSE 98 11/24/2013 0458  ? BUN 9 09/20/2021 0755  ? BUN 6 (L) 11/24/2013 0458  ? CREATININE 0.57 09/20/2021 0755  ? CREATININE 0.69 11/24/2013 0458  ? CALCIUM 9.4 09/20/2021 0755  ? CALCIUM 8.7 11/24/2013 0458  ? PROT 6.9 09/20/2021 0755  ? PROT 7.7 08/10/2013 1440  ? ALBUMIN 4.4 09/20/2021 0755  ? ALBUMIN 3.5 11/24/2013 0458  ? AST 16 09/20/2021 0755  ? AST 22 08/10/2013 1440  ? ALT 7 09/20/2021 0755  ? ALT 11 (L) 08/10/2013 1440  ? ALKPHOS 53 09/20/2021 0755  ? ALKPHOS 51 08/10/2013 1440  ? BILITOT 0.5 09/20/2021 0755  ? BILITOT 0.2  08/10/2013 1440  ? GFRNONAA >60 05/15/2021 0516  ? GFRNONAA >60 11/24/2013 0458  ? GFRAA 124 04/04/2020 0734  ? GFRAA >60 11/24/2013 0458  ?  ? ?Assessment and Plan: ?1. Iron deficiency anemia due to chronic blood loss   ?  ?#Iron deficiency anemia due to menorrhagia. ?Labs reviewed and discussed with patient.  She has previously tolerated IV Venofer treatments well ?Hemoglobin level is 10.4, iron panel has improved with iron saturation 15, ferritin 74. ?I recommend patient to take vitamin C 1 tablet daily as maintenance. ?Patient  may have underlying hemoglobinopathy. ?Check CBC, iron TIBC ferritin B12, folate hemoglobinopathy evaluation at next visit. ? ? ?I discussed the assessment and treatment plan with the patient. The patient was provided an opportunity to ask questions and all were answered. The patient agreed with the plan and demonstrated an understanding of the instructions.  ?The patient was advised to call back or seek an in-person evaluation if the symptoms worsen or if the condition fails to improve as anticipated.  ? ?We spent sufficient time to discuss many aspect of care, questions were answered to patient's satisfaction. ? ?A total of 15 minutes was spent on this visit.   ? ?Follow-up in 4 months. ?Earlie Server, MD 10/31/2021 9:31 AM  ? ?

## 2021-11-12 ENCOUNTER — Encounter: Payer: Self-pay | Admitting: Oncology

## 2021-11-12 MED ORDER — VITRON-C 65-125 MG PO TABS: 1.0000 | ORAL_TABLET | Freq: Every day | ORAL | 3 refills | Status: AC

## 2022-03-04 ENCOUNTER — Inpatient Hospital Stay: Payer: Medicaid Other

## 2022-03-05 ENCOUNTER — Inpatient Hospital Stay: Payer: Medicaid Other | Admitting: Oncology

## 2022-03-28 ENCOUNTER — Inpatient Hospital Stay: Payer: Medicaid Other | Attending: Oncology

## 2022-03-29 ENCOUNTER — Telehealth: Payer: Self-pay

## 2022-03-29 NOTE — Telephone Encounter (Signed)
Pt scheduled for Mychart visit on 9/25, she did not come for labs yesterday. Appts need to be r/s. Please r/s and inform pt of new appts (non urgent) :    Labs Mychart 1-2 days after labs

## 2022-04-01 ENCOUNTER — Inpatient Hospital Stay: Payer: Medicaid Other | Admitting: Oncology

## 2022-04-01 ENCOUNTER — Encounter: Payer: Self-pay | Admitting: Oncology

## 2022-08-26 ENCOUNTER — Encounter: Payer: Self-pay | Admitting: Oncology

## 2022-08-26 ENCOUNTER — Other Ambulatory Visit
Admission: RE | Admit: 2022-08-26 | Discharge: 2022-08-26 | Disposition: A | Discharge status: Home / Self Care | Payer: Medicaid Other | Source: Ambulatory Visit | Attending: Family Medicine | Admitting: Family Medicine

## 2022-08-26 DIAGNOSIS — Z0000 Encounter for general adult medical examination without abnormal findings: Secondary | ICD-10-CM | POA: Insufficient documentation

## 2022-08-26 DIAGNOSIS — Z01818 Encounter for other preprocedural examination: Secondary | ICD-10-CM | POA: Insufficient documentation

## 2022-08-26 LAB — APTT: aPTT: 29 seconds (ref 24–36)

## 2022-09-03 ENCOUNTER — Encounter: Payer: Medicaid Other | Admitting: Nurse Practitioner

## 2022-09-23 ENCOUNTER — Encounter: Payer: Medicaid Other | Admitting: Nurse Practitioner

## 2022-11-20 IMAGING — MG MM DIGITAL SCREENING BILAT W/ TOMO AND CAD
8 series · 8 of 24 positions shown · non-contrast
Comparison: None.

CLINICAL DATA: Screening.

EXAM:
DIGITAL SCREENING BILATERAL MAMMOGRAM WITH TOMOSYNTHESIS AND CAD
TECHNIQUE: Bilateral screening digital craniocaudal and mediolateral oblique
mammograms were obtained. Bilateral screening digital breast
tomosynthesis was performed. The images were evaluated with
computer-aided detection.

[R CC synth-2D]
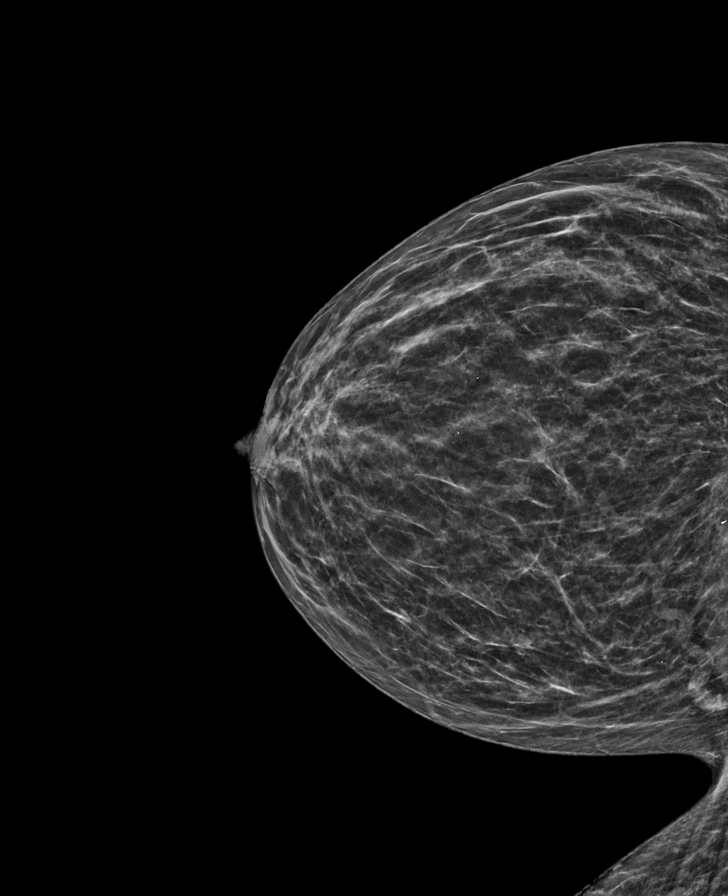

[L MLO synth-2D]
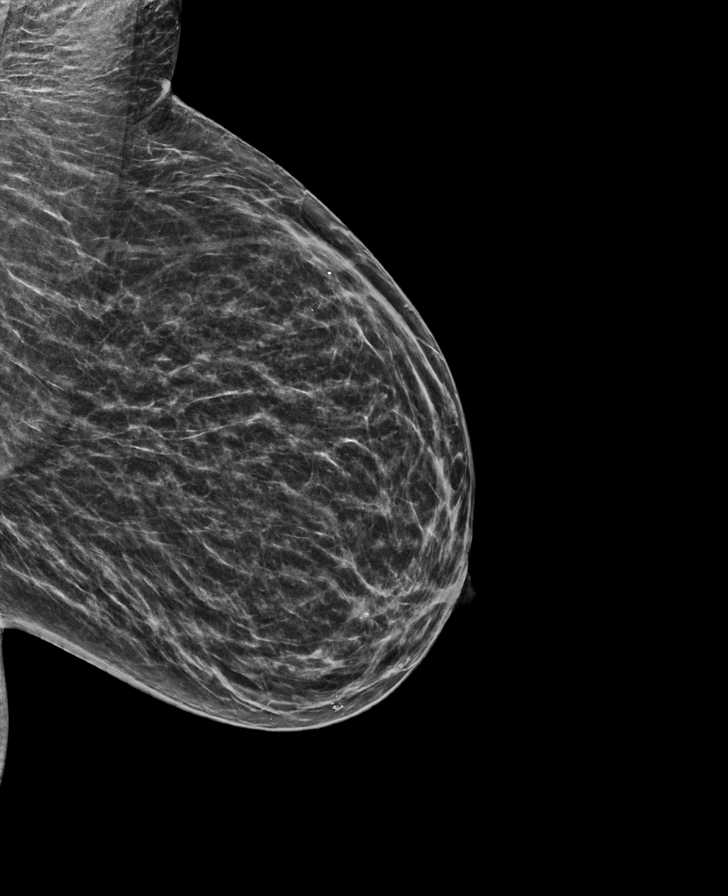

[R MLO synth-2D]
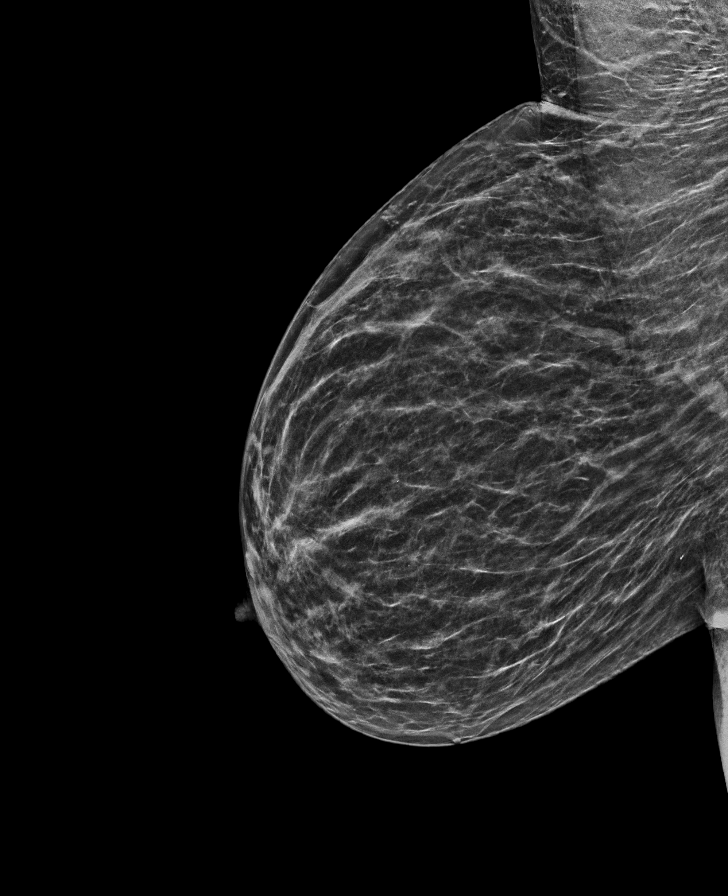

[L CC synth-2D]
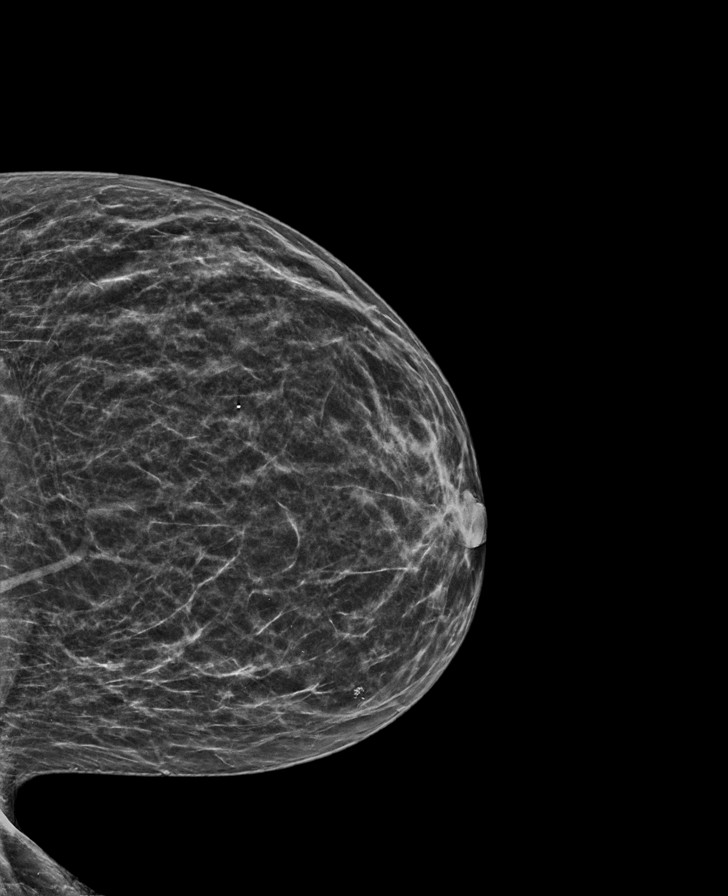

[R MLO tomo · tomo slice 26/51.0]
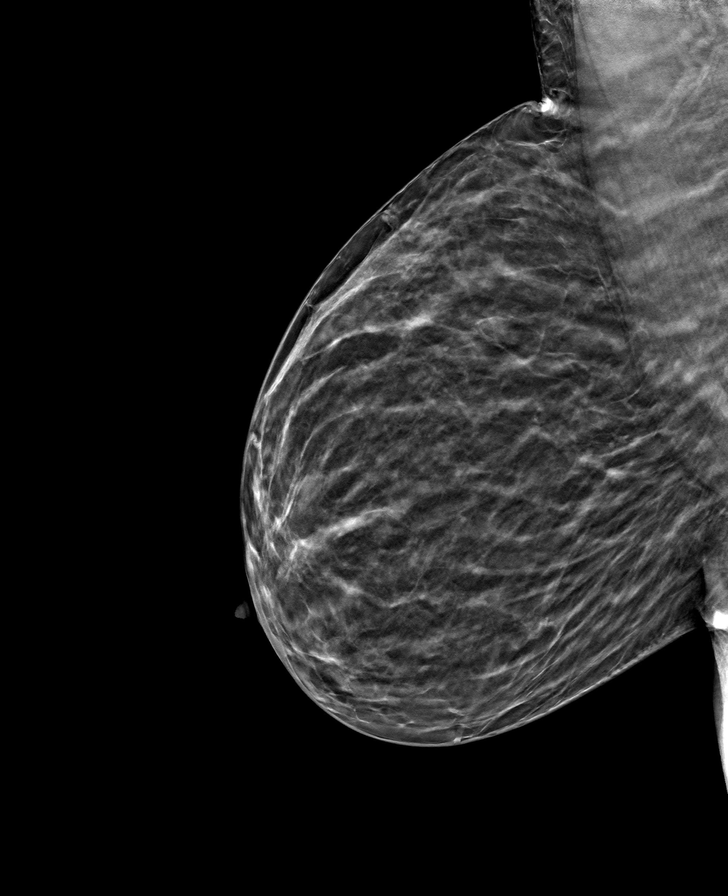

[L MLO tomo · tomo slice 28/55.0]
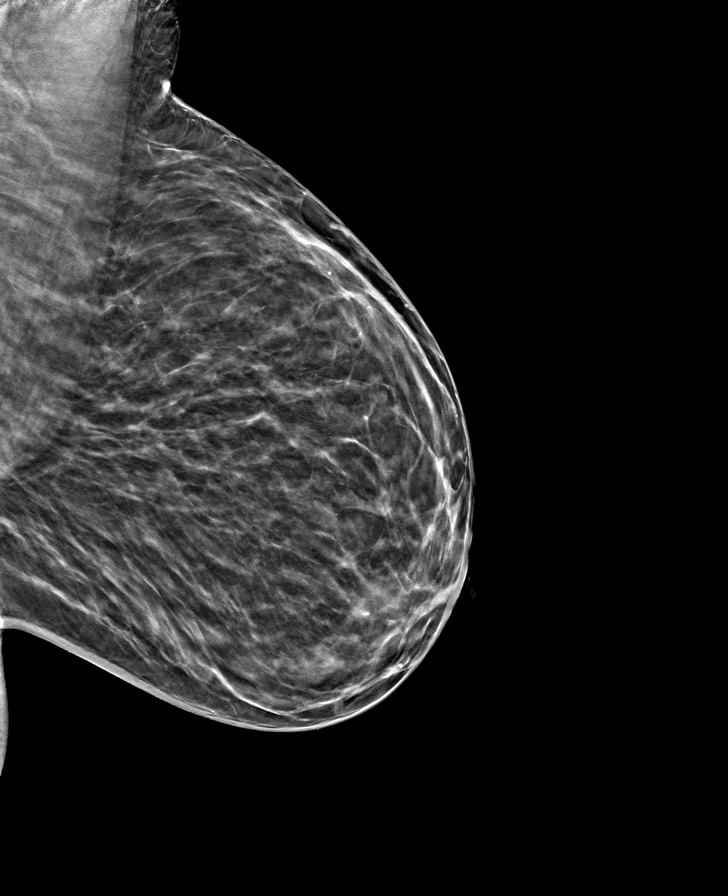

[R CC tomo · tomo slice 23/46.0]
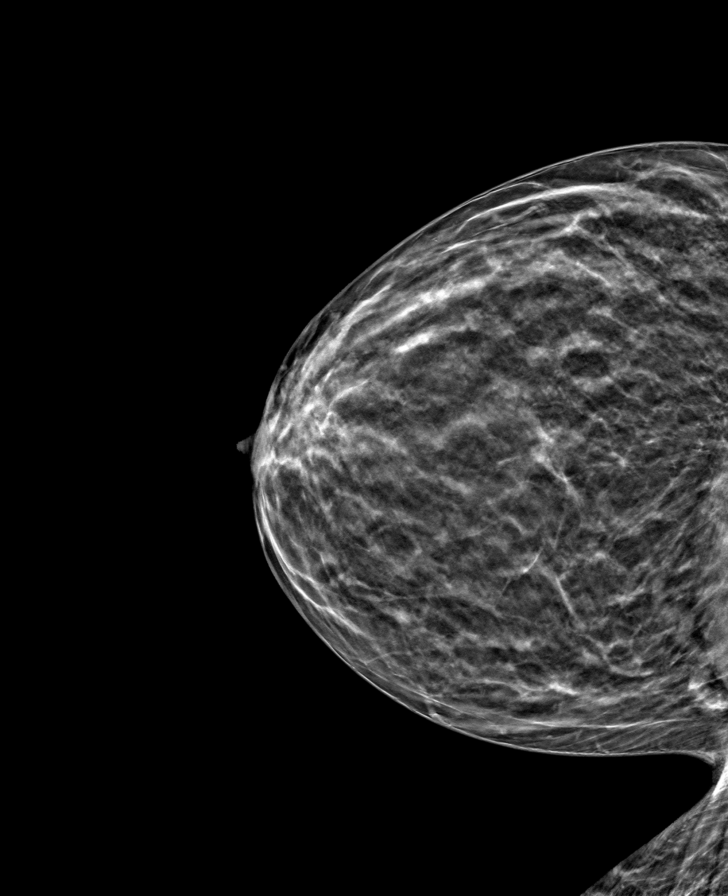

[L CC tomo · tomo slice 25/50.0]
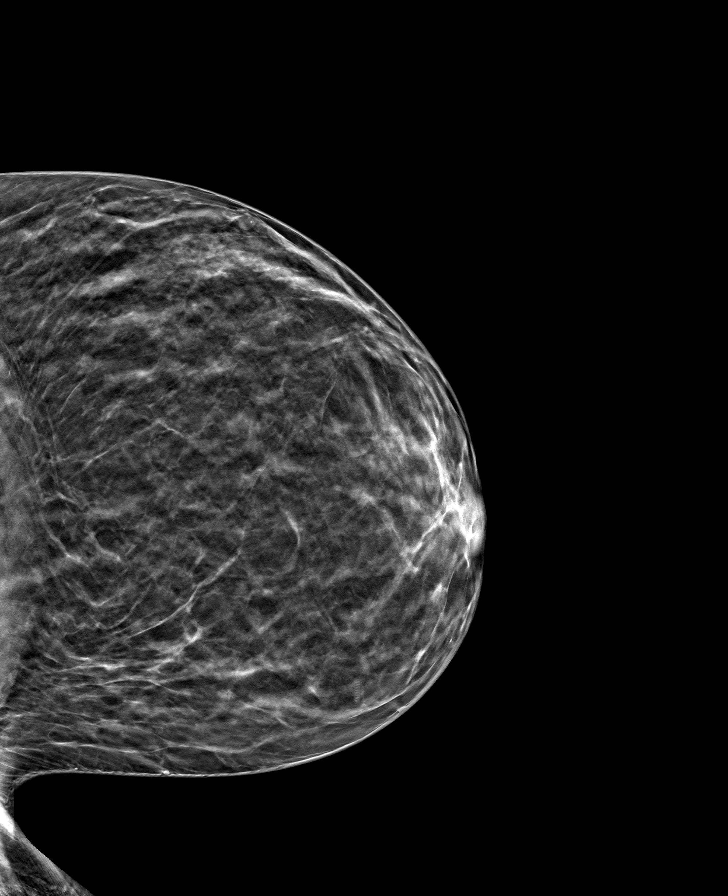

[8 of 24 positions shown; findings below may reference images not displayed]

ACR Breast Density Category b: There are scattered areas of
fibroglandular density.
FINDINGS: There are no findings suspicious for malignancy.
IMPRESSION: No mammographic evidence of malignancy. A result letter of this
screening mammogram will be mailed directly to the patient.

RECOMMENDATION:
Screening mammogram in one year. (Code:XG-X-X7B)

BI-RADS CATEGORY  1: Negative.

## 2024-03-26 ENCOUNTER — Encounter: Payer: Self-pay | Admitting: Family Medicine

## 2024-03-26 DIAGNOSIS — Z1231 Encounter for screening mammogram for malignant neoplasm of breast: Secondary | ICD-10-CM
